# Patient Record
Sex: Male | Born: 1935 | Race: White | Hispanic: No | Marital: Married | Smoking: Never smoker
Health system: Southern US, Community
[De-identification: ages and names within clinical notes are randomized; demographics above are authoritative.]

## PROBLEM LIST (undated history)

## (undated) DIAGNOSIS — I10 Essential (primary) hypertension: Secondary | ICD-10-CM

## (undated) DIAGNOSIS — I639 Cerebral infarction, unspecified: Secondary | ICD-10-CM

## (undated) HISTORY — DX: Cerebral infarction, unspecified: I63.9

## (undated) HISTORY — DX: Essential (primary) hypertension: I10

---

## 2019-12-02 ENCOUNTER — Ambulatory Visit: Payer: Self-pay

## 2020-06-27 ENCOUNTER — Emergency Department (HOSPITAL_BASED_OUTPATIENT_CLINIC_OR_DEPARTMENT_OTHER): Payer: BC Managed Care – PPO

## 2020-06-27 ENCOUNTER — Emergency Department (HOSPITAL_BASED_OUTPATIENT_CLINIC_OR_DEPARTMENT_OTHER)
Admission: EM | Admit: 2020-06-27 | Discharge: 2020-06-28 | Disposition: A | Discharge status: Home / Self Care | Payer: BC Managed Care – PPO | Attending: Emergency Medicine | Admitting: Emergency Medicine

## 2020-06-27 ENCOUNTER — Other Ambulatory Visit: Payer: Self-pay

## 2020-06-27 ENCOUNTER — Encounter (HOSPITAL_BASED_OUTPATIENT_CLINIC_OR_DEPARTMENT_OTHER): Payer: Self-pay | Admitting: Emergency Medicine

## 2020-06-27 DIAGNOSIS — Z7901 Long term (current) use of anticoagulants: Secondary | ICD-10-CM | POA: Insufficient documentation

## 2020-06-27 DIAGNOSIS — R42 Dizziness and giddiness: Secondary | ICD-10-CM | POA: Insufficient documentation

## 2020-06-27 DIAGNOSIS — I10 Essential (primary) hypertension: Secondary | ICD-10-CM | POA: Insufficient documentation

## 2020-06-27 LAB — COMPREHENSIVE METABOLIC PANEL
ALT: 12 U/L (ref 0–44)
AST: 14 U/L — ABNORMAL LOW (ref 15–41)
Albumin: 4.3 g/dL (ref 3.5–5.0)
Alkaline Phosphatase: 32 U/L — ABNORMAL LOW (ref 38–126)
Anion gap: 10 (ref 5–15)
BUN: 17 mg/dL (ref 8–23)
CO2: 27 mmol/L (ref 22–32)
Calcium: 9.2 mg/dL (ref 8.9–10.3)
Chloride: 103 mmol/L (ref 98–111)
Creatinine, Ser: 1.2 mg/dL (ref 0.61–1.24)
GFR calc Af Amer: >60 mL/min (ref >60)
GFR calc non Af Amer: 56 mL/min — ABNORMAL LOW (ref >60)
Glucose, Bld: 104 mg/dL — ABNORMAL HIGH (ref 70–99)
Potassium: 3.6 mmol/L (ref 3.5–5.1)
Sodium: 140 mmol/L (ref 135–145)
Total Bilirubin: 0.4 mg/dL (ref 0.3–1.2)
Total Protein: 7.3 g/dL (ref 6.5–8.1)

## 2020-06-27 LAB — CBC WITH DIFFERENTIAL/PLATELET
Abs Immature Granulocytes: 0.03 10*3/uL (ref 0.00–0.07)
Basophils Absolute: 0.1 10*3/uL (ref 0.0–0.1)
Basophils Relative: 1 %
Eosinophils Absolute: 0.2 10*3/uL (ref 0.0–0.5)
Eosinophils Relative: 3 %
HCT: 46.5 % (ref 39.0–52.0)
Hemoglobin: 15.6 g/dL (ref 13.0–17.0)
Immature Granulocytes: 0 %
Lymphocytes Relative: 17 %
Lymphs Abs: 1.3 10*3/uL (ref 0.7–4.0)
MCH: 31 pg (ref 26.0–34.0)
MCHC: 33.5 g/dL (ref 30.0–36.0)
MCV: 92.4 fL (ref 80.0–100.0)
Monocytes Absolute: 0.5 10*3/uL (ref 0.1–1.0)
Monocytes Relative: 6 %
Neutro Abs: 5.6 10*3/uL (ref 1.7–7.7)
Neutrophils Relative %: 73 %
Platelets: 196 10*3/uL (ref 150–400)
RBC: 5.03 MIL/uL (ref 4.22–5.81)
RDW: 13.5 % (ref 11.5–15.5)
WBC: 7.7 10*3/uL (ref 4.0–10.5)
nRBC: 0 % (ref 0.0–0.2)

## 2020-06-27 LAB — TROPONIN I (HIGH SENSITIVITY): Troponin I (High Sensitivity): 6 ng/L (ref <18)

## 2020-06-27 LAB — PROTIME-INR
INR: 2.6 — ABNORMAL HIGH (ref 0.8–1.2)
Prothrombin Time: 27.3 seconds — ABNORMAL HIGH (ref 11.4–15.2)

## 2020-06-27 MED ORDER — MECLIZINE HCL 25 MG PO TABS
12.5000 mg | ORAL_TABLET | Freq: Once | ORAL | Status: AC
  Administered 2020-06-27: 12.5 mg via ORAL
  Filled 2020-06-27: qty 1

## 2020-06-27 MED ORDER — IOHEXOL 350 MG/ML SOLN
100.0000 mL | Freq: Once | INTRAVENOUS | Status: AC | PRN
  Administered 2020-06-27: 100 mL via INTRAVENOUS

## 2020-06-27 MED ORDER — METOCLOPRAMIDE HCL 5 MG/ML IJ SOLN
10.0000 mg | Freq: Once | INTRAMUSCULAR | Status: AC
  Administered 2020-06-27: 10 mg via INTRAVENOUS
  Filled 2020-06-27: qty 2

## 2020-06-27 MED ORDER — AMLODIPINE BESYLATE 5 MG PO TABS: 5.0000 mg | ORAL_TABLET | Freq: Once | ORAL | Status: DC

## 2020-06-27 MED ORDER — SODIUM CHLORIDE 0.9 % IV BOLUS
500.0000 mL | Freq: Once | INTRAVENOUS | Status: AC
  Administered 2020-06-27: 500 mL via INTRAVENOUS

## 2020-06-27 MED ORDER — DIPHENHYDRAMINE HCL 50 MG/ML IJ SOLN
12.5000 mg | Freq: Once | INTRAMUSCULAR | Status: AC
  Administered 2020-06-27: 12.5 mg via INTRAVENOUS
  Filled 2020-06-27: qty 1

## 2020-06-27 MED ORDER — SODIUM CHLORIDE 0.9 % IV BOLUS: 1000.0000 mL | Freq: Once | INTRAVENOUS | Status: DC

## 2020-06-27 NOTE — ED Triage Notes (Addendum)
Headache and dizziness since this am, states his b/p is up Ambulatory, States lightheaded. Pt amb. Steady gait. HX of dizziness per pt. PT states off bp meds for a month and was told to get back on it it his b/p was elevated. Pt not sure of how much to take and was told by PMD to come to ED.

## 2020-06-27 NOTE — ED Notes (Signed)
Swallow screen repeated, pt passed.

## 2020-06-27 NOTE — ED Provider Notes (Signed)
[facility] EMERGENCY DEPARTMENT Provider Note   CSN: [id number] Arrival date & time: 06/27/20  1922     History Chief Complaint  Patient presents with   Dizziness    Caleb Santana is a 85 y.o. male hx of HTN, cerebellar ataxia, here presenting with dizziness.  Patient has been having dizziness at ongoing for the last month or so.  Patient was thought to have orthostatic hypotension.  Patient's blood pressure medicine was discontinued recently due to orthostatic hypotension. Patient actually recently seen his neurologist several days ago and was thought to have cerebellar ataxia and orthostatic hypotension.  Patient states that today he has worsening dizziness.  His blood pressure also was elevated at 170s so he called his doctor and was sent in for evaluation.  Patient denies any trouble speaking or focal weakness.  The history is provided by the patient.       Past Medical History:  Diagnosis Date   Hypertension    Stroke [facility])     There are no problems to display for this patient.   History reviewed. No pertinent surgical history.     No family history on file.  Social History   Tobacco Use   Smoking status: Never Smoker   Smokeless tobacco: Never Used  Substance Use Topics   Alcohol use: Not on file   Drug use: Not on file    Home Medications Prior to Admission medications   Medication Sig Start Date End Date Taking? Authorizing Provider  tiotropium (SPIRIVA HANDIHALER) 18 MCG inhalation capsule INSERT 1 CAPSULE IN HANDIHALER AND INHALE THE CONTENTS BY MOUTH ONCE DAILY 01/03/16  Yes [provider]  warfarin (COUMADIN) 5 MG tablet TAKE 1 & 1/2 TABLET BY MOUTH ONCE DAILY EXCEPT TAKE 1 TABLET ON TUESDAYS &THURSDAYS and saturdays OR AS DIRECTED 05/20/10  Yes [provider]  BREO ELLIPTA 100-25 MCG/INH AEPB Inhale 1 puff into the lungs daily. 06/24/20   [provider]  clonazePAM (KLONOPIN) 1 MG tablet SMARTSIG:1 Tablet(s) By  Mouth Every Evening 06/25/20   [provider]  dutasteride (AVODART) 0.5 MG capsule Take 0.5 mg by mouth daily. 06/14/20   [provider]  hydrochlorothiazide (MICROZIDE) 12.5 MG capsule Take by mouth.    [provider]  loratadine (CLARITIN) 10 MG tablet Take by mouth.    [provider]  nitroGLYCERIN (NITROSTAT) 0.4 MG SL tablet SMARTSIG:1 Tablet(s) Sublingual PRN 02/08/20   [provider]  rosuvastatin (CRESTOR) 5 MG tablet Take by mouth.    [provider]    Allergies    Patient has no known allergies.  Review of Systems   Review of Systems  Neurological: Positive for dizziness.  All other systems reviewed and are negative.   Physical Exam Updated Vital Signs BP (!) 159/95    Pulse 84    Temp 98.2 F (36.8 C) (Oral)    Resp 18    Ht 5\' 6"  (1.676 m)    Wt 83.9 kg    SpO2 95%    BMI 29.86 kg/m   Physical Exam Vitals and nursing note reviewed.  HENT:     Head: Normocephalic.     Nose: Nose normal.     Mouth/Throat:     Mouth: Mucous membranes are moist.  Eyes:     Extraocular Movements: Extraocular movements intact.     Pupils: Pupils are equal, round, and reactive to light.     Comments: No nystagmus   Cardiovascular:     Rate  and Rhythm: Normal rate and regular rhythm.     Pulses: Normal pulses.     Heart sounds: Normal heart sounds.  Pulmonary:     Effort: Pulmonary effort is normal.     Breath sounds: Normal breath sounds.  Abdominal:     General: Abdomen is flat.     Palpations: Abdomen is soft.  Musculoskeletal:        General: Normal range of motion.     Cervical back: Normal range of motion.  Skin:    General: Skin is warm.     Capillary Refill: Capillary refill takes less than 2 seconds.  Neurological:     General: No focal deficit present.     Mental Status: He is alert and oriented to person, place, and time.     Comments: Cranial nerve II to XII intact.  Normal finger-to-nose bilaterally.  No  pronator drift.  Normal strength and sensation throughout.  Ambulated by himself with steady gait.   Psychiatric:        Mood and Affect: Mood normal.     ED Results / Procedures / Treatments   Labs (all labs ordered are listed, but only abnormal results are displayed) Labs Reviewed  COMPREHENSIVE METABOLIC PANEL - Abnormal; Notable for the following components:      Result Value   Glucose, Bld 104 (*)    AST 14 (*)    Alkaline Phosphatase 32 (*)    GFR calc non Af Amer 56 (*)    All other components within normal limits  PROTIME-INR - Abnormal; Notable for the following components:   Prothrombin Time 27.3 (*)    INR 2.6 (*)    All other components within normal limits  CBC WITH DIFFERENTIAL/PLATELET  TROPONIN I (HIGH SENSITIVITY)  TROPONIN I (HIGH SENSITIVITY)    EKG EKG Interpretation  Date/Time:  Thursday June 27 2020 19:32:36 EDT Ventricular Rate:  78 PR Interval:  174 QRS Duration: 82 QT Interval:  358 QTC Calculation: 408 R Axis:   48 Text Interpretation: Normal sinus rhythm Septal infarct , age undetermined Abnormal ECG No previous ECGs available Confirmed by Richardean Canal [phone]) on 06/27/2020 8:41:26 PM   Radiology No results found.  Procedures Procedures (including critical care time)  Medications Ordered in ED Medications  metoCLOPramide (REGLAN) injection 10 mg (10 mg Intravenous Given 06/27/20 2156)  diphenhydrAMINE (BENADRYL) injection 12.5 mg (12.5 mg Intravenous Given 06/27/20 2205)  meclizine (ANTIVERT) tablet 12.5 mg (12.5 mg Oral Given 06/27/20 2318)  iohexol (OMNIPAQUE) 350 MG/ML injection 100 mL (100 mLs Intravenous Contrast Given 06/27/20 2248)  sodium chloride 0.9 % bolus 500 mL (500 mLs Intravenous New Bag/Given 06/27/20 2321)    ED Course  I have reviewed the triage vital signs and the nursing notes.  Pertinent labs & imaging results that were available during my care of the patient were reviewed by me and considered in my medical decision  making (see chart for details).    MDM Rules/Calculators/A&P                          Caleb Santana is a 85 y.o. male here with dizziness and hypertension.  Patient has a history of hypertension but also orthostatic hypotension so blood pressure medicine was discontinued. I am unclear if he had rebound hypertension or aneurysm rupture or just migraines. Plan to get CBC, CMP, CTA head and neck.  His dizziness has been going on for the last month or so  he has no other signs of stroke and have unremarkable neuro exam so if CTA is negative we will hold off on MRI right now  11:30 PM BP came down to 140.  Patient felt slightly dizzy when he stood up but is not orthostatic.  Patient felt better after migraine cocktail.  His INR is 2.6.  CTA head and neck pending. If negative, anticipate dc home.  Since he has orthostatic hypotension previously and is symptomatic even when he stands up, we will not put patient on blood pressure medicine.  He does have neurology follow-up already.  I signed out to Dr. Read Drivers, anticipate dc home if CTA negative.   Final Clinical Impression(s) / ED Diagnoses Final diagnoses:  None    Rx / DC Orders ED Discharge Orders    None       Charlynne Pander, MD 06/27/20 2332

## 2020-06-27 NOTE — Discharge Instructions (Signed)
You have orthostatic hypotension so please stay hydrated.  See your doctor in a week for follow-up.  Please also follow-up with neurologist.  Return to ER if you have worse dizziness, trouble speaking, trouble walking, weakness.

## 2020-06-28 NOTE — ED Provider Notes (Signed)
Nursing notes and vitals signs, including pulse oximetry, reviewed.  Summary of this visit's results, reviewed by myself:  EKG:  EKG Interpretation  Date/Time:  Thursday June 27 2020 19:32:36 EDT Ventricular Rate:  78 PR Interval:  174 QRS Duration: 82 QT Interval:  358 QTC Calculation: 408 R Axis:   48 Text Interpretation: Normal sinus rhythm Septal infarct , age undetermined Abnormal ECG No previous ECGs available Confirmed by Richardean CanalYao, David H [phone]([id number]) on 06/27/2020 8:41:26 PM       Labs:  Results for orders placed or performed during the hospital encounter of 06/27/20 (from the past 24 hour(s))  CBC with Differential/Platelet     Status: None   Collection Time: 06/27/20 10:09 PM  Result Value Ref Range   WBC 7.7 4.0 - 10.5 K/uL   RBC 5.03 4.22 - 5.81 MIL/uL   Hemoglobin 15.6 13.0 - 17.0 g/dL   HCT 19.146.5 39 - 52 %   MCV 92.4 80.0 - 100.0 fL   MCH 31.0 26.0 - 34.0 pg   MCHC 33.5 30.0 - 36.0 g/dL   RDW 47.813.5 29.511.5 - 62.115.5 %   Platelets 196 150 - 400 K/uL   nRBC 0.0 0.0 - 0.2 %   Neutrophils Relative % 73 %   Neutro Abs 5.6 1.7 - 7.7 K/uL   Lymphocytes Relative 17 %   Lymphs Abs 1.3 0.7 - 4.0 K/uL   Monocytes Relative 6 %   Monocytes Absolute 0.5 0 - 1 K/uL   Eosinophils Relative 3 %   Eosinophils Absolute 0.2 0 - 0 K/uL   Basophils Relative 1 %   Basophils Absolute 0.1 0 - 0 K/uL   Immature Granulocytes 0 %   Abs Immature Granulocytes 0.03 0.00 - 0.07 K/uL  Comprehensive metabolic panel     Status: Abnormal   Collection Time: 06/27/20 10:09 PM  Result Value Ref Range   Sodium 140 135 - 145 mmol/L   Potassium 3.6 3.5 - 5.1 mmol/L   Chloride 103 98 - 111 mmol/L   CO2 27 22 - 32 mmol/L   Glucose, Bld 104 (H) 70 - 99 mg/dL   BUN 17 8 - 23 mg/dL   Creatinine, Ser 3.081.20 0.61 - 1.24 mg/dL   Calcium 9.2 8.9 - 65.710.3 mg/dL   Total Protein 7.3 6.5 - 8.1 g/dL   Albumin 4.3 3.5 - 5.0 g/dL   AST 14 (L) 15 - 41 U/L   ALT 12 0 - 44 U/L   Alkaline Phosphatase 32 (L) 38 - 126 U/L    Total Bilirubin 0.4 0.3 - 1.2 mg/dL   GFR calc non Af Amer 56 (L) >60 mL/min   GFR calc Af Amer >60 >60 mL/min   Anion gap 10 5 - 15  Troponin I (High Sensitivity)     Status: None   Collection Time: 06/27/20 10:09 PM  Result Value Ref Range   Troponin I (High Sensitivity) 6 <18 ng/L  Protime-INR     Status: Abnormal   Collection Time: 06/27/20 10:09 PM  Result Value Ref Range   Prothrombin Time 27.3 (H) 11.4 - 15.2 seconds   INR 2.6 (H) 0.8 - 1.2    Imaging Studies: CT Angio Head W or Wo Contrast  Result Date: 06/28/2020 CLINICAL DATA:  Initial evaluation for acute dizziness. EXAM: CT ANGIOGRAPHY HEAD AND NECK TECHNIQUE: Multidetector CT imaging of the head and neck was performed using the standard protocol during bolus administration of intravenous contrast. Multiplanar CT image reconstructions and MIPs were obtained  to evaluate the vascular anatomy. Carotid stenosis measurements (when applicable) are obtained utilizing NASCET criteria, using the distal internal carotid diameter as the denominator. CONTRAST:  OMNIPAQUE IOHEXOL 350 MG/ML SOLN COMPARISON:  Prior study from 08/19/2016. FINDINGS: CT HEAD FINDINGS Brain: Age-related cerebral atrophy with moderate chronic microvascular ischemic disease. No acute intracranial hemorrhage. No acute large vessel territory infarct. No mass lesion or midline shift. No hydrocephalus or extra-axial fluid collection. Vascular: No hyperdense vessel. Scattered vascular calcifications noted within the carotid siphons. Skull: Scalp soft tissues and calvarium within normal limits. Sinuses: Paranasal sinuses and mastoid air cells are clear. Orbits: Globes and orbital soft tissues demonstrate no acute finding. CTA NECK FINDINGS Aortic arch: Visualized aortic arch of normal caliber with normal branch pattern. Extensive atherosclerotic change present about the arch and origin of the great vessels. No hemodynamically significant stenosis. Right carotid system:  Right CCA patent from its origin to the bifurcation without stenosis. Mild eccentric plaque at the right bifurcation/proximal right ICA without significant stenosis. Right ICA patent distally without stenosis, dissection or occlusion. Left carotid system: Left CCA patent from its origin to the bifurcation without stenosis. Scattered calcified plaque about the left bifurcation/proximal left ICA with associated mild stenosis of up to 25% by NASCET criteria. Left ICA patent distally without stenosis, dissection or occlusion. Vertebral arteries: Left vertebral artery arises directly from the aortic arch. Short-segment 40-50% ostial stenosis noted at the origins of the vertebral arteries bilaterally. Vertebral arteries otherwise widely patent without stenosis, dissection or occlusion. Skeleton: No acute osseous abnormality. No worrisome osseous lesions. Moderate to advanced multilevel cervical spondylosis, most pronounced at C6-7. Other neck: No other acute abnormality within the neck. No mass lesion or adenopathy. Upper chest: Visualized upper chest demonstrates no acute finding. Mild atelectatic changes noted within the right upper lobe. Underlying centrilobular emphysema. Review of the MIP images confirms the above findings CTA HEAD FINDINGS Anterior circulation: Scattered atheromatous change throughout the petrous, cavernous, and supraclinoid ICAs without hemodynamically significant stenosis. A1 segments widely patent. Normal anterior communicating artery complex. Anterior cerebral arteries widely patent to their distal aspects without stenosis. No M1 stenosis or occlusion. Normal left MCA bifurcation. Right MCA trifurcations. Distal MCA branches well perfused and symmetric. Posterior circulation: Both vertebral arteries widely patent to the vertebrobasilar junction without stenosis. Right vertebral artery slightly dominant. Both picas patent. Basilar widely patent to its distal aspect without stenosis. Superior  cerebral arteries patent bilaterally. Both PCAs well perfused to their distal aspects without flow-limiting stenosis. Small bilateral posterior communicating arteries noted. Venous sinuses: Grossly patent allowing for timing the contrast bolus. Anatomic variants: None significant.  No intracranial aneurysm. Review of the MIP images confirms the above findings IMPRESSION: CT HEAD IMPRESSION: 1. No acute intracranial abnormality. 2. Generalized age-related cerebral atrophy with moderate chronic microvascular ischemic disease. CTA HEAD AND NECK IMPRESSION: 1. Negative CTA for large vessel occlusion. 2. Short-segment 40-50% ostial stenoses at the origins of the vertebral arteries bilaterally. Posterior circulation otherwise widely patent. 3. Additional moderate atherosclerotic change elsewhere about the major arterial vasculature of the head and neck. No other hemodynamically significant or correctable stenosis. 4. Aortic Atherosclerosis (ICD10-I70.0) and Emphysema (ICD10-J43.9). Electronically Signed   By: Rise Mu M.D.   On: 06/28/2020 00:05   CT Angio Neck W and/or Wo Contrast  Result Date: 06/28/2020 CLINICAL DATA:  Initial evaluation for acute dizziness. EXAM: CT ANGIOGRAPHY HEAD AND NECK TECHNIQUE: Multidetector CT imaging of the head and neck was performed using the standard protocol during bolus administration of  intravenous contrast. Multiplanar CT image reconstructions and MIPs were obtained to evaluate the vascular anatomy. Carotid stenosis measurements (when applicable) are obtained utilizing NASCET criteria, using the distal internal carotid diameter as the denominator. CONTRAST:  OMNIPAQUE IOHEXOL 350 MG/ML SOLN COMPARISON:  Prior study from 08/19/2016. FINDINGS: CT HEAD FINDINGS Brain: Age-related cerebral atrophy with moderate chronic microvascular ischemic disease. No acute intracranial hemorrhage. No acute large vessel territory infarct. No mass lesion or midline shift. No  hydrocephalus or extra-axial fluid collection. Vascular: No hyperdense vessel. Scattered vascular calcifications noted within the carotid siphons. Skull: Scalp soft tissues and calvarium within normal limits. Sinuses: Paranasal sinuses and mastoid air cells are clear. Orbits: Globes and orbital soft tissues demonstrate no acute finding. CTA NECK FINDINGS Aortic arch: Visualized aortic arch of normal caliber with normal branch pattern. Extensive atherosclerotic change present about the arch and origin of the great vessels. No hemodynamically significant stenosis. Right carotid system: Right CCA patent from its origin to the bifurcation without stenosis. Mild eccentric plaque at the right bifurcation/proximal right ICA without significant stenosis. Right ICA patent distally without stenosis, dissection or occlusion. Left carotid system: Left CCA patent from its origin to the bifurcation without stenosis. Scattered calcified plaque about the left bifurcation/proximal left ICA with associated mild stenosis of up to 25% by NASCET criteria. Left ICA patent distally without stenosis, dissection or occlusion. Vertebral arteries: Left vertebral artery arises directly from the aortic arch. Short-segment 40-50% ostial stenosis noted at the origins of the vertebral arteries bilaterally. Vertebral arteries otherwise widely patent without stenosis, dissection or occlusion. Skeleton: No acute osseous abnormality. No worrisome osseous lesions. Moderate to advanced multilevel cervical spondylosis, most pronounced at C6-7. Other neck: No other acute abnormality within the neck. No mass lesion or adenopathy. Upper chest: Visualized upper chest demonstrates no acute finding. Mild atelectatic changes noted within the right upper lobe. Underlying centrilobular emphysema. Review of the MIP images confirms the above findings CTA HEAD FINDINGS Anterior circulation: Scattered atheromatous change throughout the petrous, cavernous, and  supraclinoid ICAs without hemodynamically significant stenosis. A1 segments widely patent. Normal anterior communicating artery complex. Anterior cerebral arteries widely patent to their distal aspects without stenosis. No M1 stenosis or occlusion. Normal left MCA bifurcation. Right MCA trifurcations. Distal MCA branches well perfused and symmetric. Posterior circulation: Both vertebral arteries widely patent to the vertebrobasilar junction without stenosis. Right vertebral artery slightly dominant. Both picas patent. Basilar widely patent to its distal aspect without stenosis. Superior cerebral arteries patent bilaterally. Both PCAs well perfused to their distal aspects without flow-limiting stenosis. Small bilateral posterior communicating arteries noted. Venous sinuses: Grossly patent allowing for timing the contrast bolus. Anatomic variants: None significant.  No intracranial aneurysm. Review of the MIP images confirms the above findings IMPRESSION: CT HEAD IMPRESSION: 1. No acute intracranial abnormality. 2. Generalized age-related cerebral atrophy with moderate chronic microvascular ischemic disease. CTA HEAD AND NECK IMPRESSION: 1. Negative CTA for large vessel occlusion. 2. Short-segment 40-50% ostial stenoses at the origins of the vertebral arteries bilaterally. Posterior circulation otherwise widely patent. 3. Additional moderate atherosclerotic change elsewhere about the major arterial vasculature of the head and neck. No other hemodynamically significant or correctable stenosis. 4. Aortic Atherosclerosis (ICD10-I70.0) and Emphysema (ICD10-J43.9). Electronically Signed   By: Rise Mu M.D.   On: 06/28/2020 00:05   No evidence of significant large vessel occlusion and only 40 to 50% stenoses of the vertebral arteries are seen.  It would be unlikely for this to cause significant cerebral ischemia.  I believe  the patient is safe to be discharged home.   Bird Swetz, Jonny Ruiz, MD 06/28/20 [phone]

## 2022-04-11 IMAGING — CT CT ANGIO HEAD
1 of 11 series · 5 of 33 positions shown · IV contrast (omnipaque)
Comparison: Prior study from 08/19/2016.

CLINICAL DATA: Initial evaluation for acute dizziness.

EXAM:
CT ANGIOGRAPHY HEAD AND NECK
TECHNIQUE: Multidetector CT imaging of the head and neck was performed using
the standard protocol during bolus administration of intravenous
contrast. Multiplanar CT image reconstructions and MIPs were
obtained to evaluate the vascular anatomy. Carotid stenosis
measurements (when applicable) are obtained utilizing NASCET
criteria, using the distal internal carotid diameter as the
denominator.
CONTRAST:  100mL OMNIPAQUE IOHEXOL 350 MG/ML SOLN

[Series 8: axial thin · axial · 0.47mm/px · z∈[+1015,+1255]mm · 5 of 360 slices shown]
[im 60/360  soft-tissue]
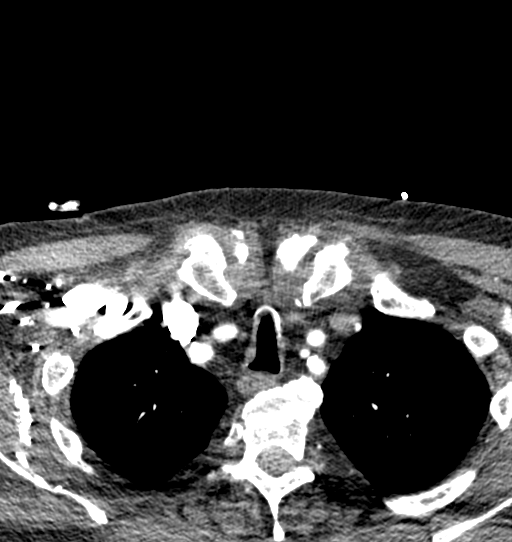
[im 120/360  bone]
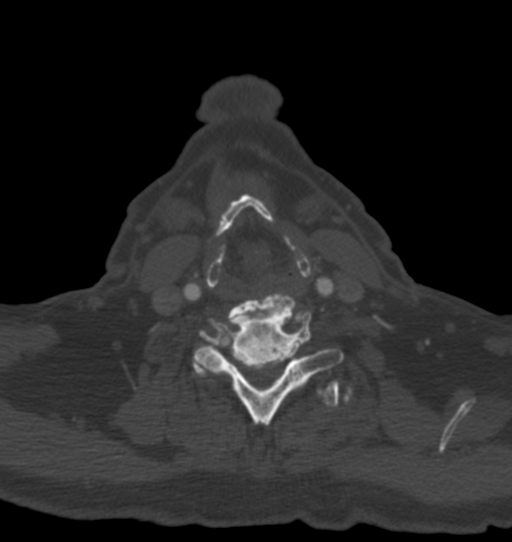
[im 180/360  soft-tissue]
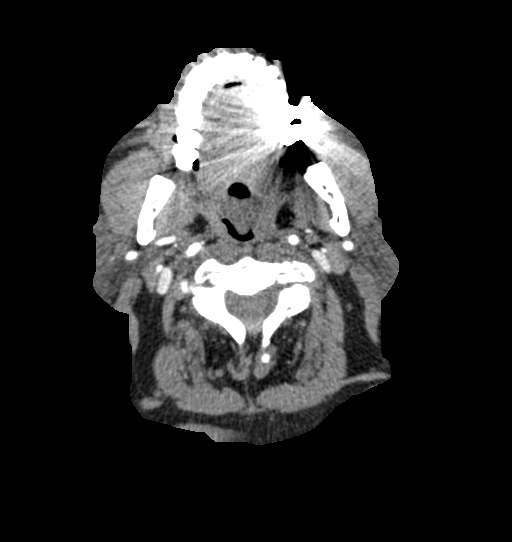
[im 240/360  bone]
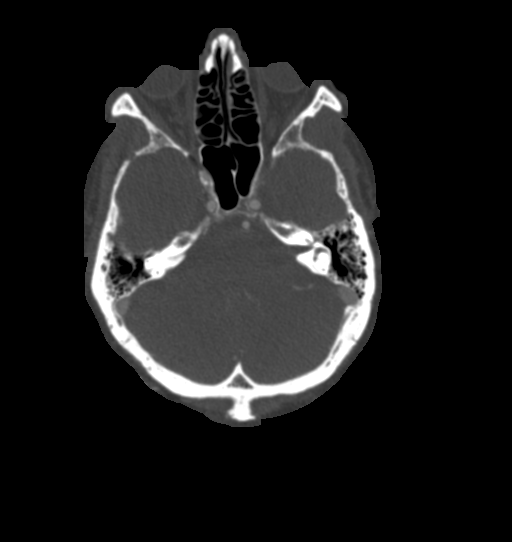
[im 300/360  soft-tissue]
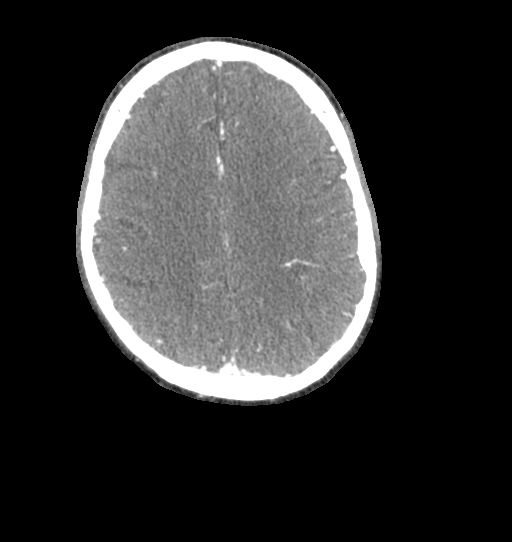

[5 of 33 positions shown; findings below may reference images not displayed]

FINDINGS: CT HEAD FINDINGS

Brain: Age-related cerebral atrophy with moderate chronic
microvascular ischemic disease. No acute intracranial hemorrhage. No
acute large vessel territory infarct. No mass lesion or midline
shift. No hydrocephalus or extra-axial fluid collection.

Vascular: No hyperdense vessel. Scattered vascular calcifications
noted within the carotid siphons.

Skull: Scalp soft tissues and calvarium within normal limits.

Sinuses: Paranasal sinuses and mastoid air cells are clear.

Orbits: Globes and orbital soft tissues demonstrate no acute
finding.

CTA NECK FINDINGS

Aortic arch: Visualized aortic arch of normal caliber with normal
branch pattern. Extensive atherosclerotic change present about the
arch and origin of the great vessels. No hemodynamically significant
stenosis.

Right carotid system: Right CCA patent from its origin to the
bifurcation without stenosis. Mild eccentric plaque at the right
bifurcation/proximal right ICA without significant stenosis. Right
ICA patent distally without stenosis, dissection or occlusion.

Left carotid system: Left CCA patent from its origin to the
bifurcation without stenosis. Scattered calcified plaque about the
left bifurcation/proximal left ICA with associated mild stenosis of
up to 25% by NASCET criteria. Left ICA patent distally without
stenosis, dissection or occlusion.

Vertebral arteries: Left vertebral artery arises directly from the
aortic arch. Short-segment 40-50% ostial stenosis noted at the
origins of the vertebral arteries bilaterally. Vertebral arteries
otherwise widely patent without stenosis, dissection or occlusion.

Skeleton: No acute osseous abnormality. No worrisome osseous
lesions. Moderate to advanced multilevel cervical spondylosis, most
pronounced at C6-7.

Other neck: No other acute abnormality within the neck. No mass
lesion or adenopathy.

Upper chest: Visualized upper chest demonstrates no acute finding.
Mild atelectatic changes noted within the right upper lobe.
Underlying centrilobular emphysema.

Review of the MIP images confirms the above findings

CTA HEAD FINDINGS

Anterior circulation: Scattered atheromatous change throughout the
petrous, cavernous, and supraclinoid ICAs without hemodynamically
significant stenosis. A1 segments widely patent. Normal anterior
communicating artery complex. Anterior cerebral arteries widely
patent to their distal aspects without stenosis. No M1 stenosis or
occlusion. Normal left MCA bifurcation. Right MCA trifurcations.
Distal MCA branches well perfused and symmetric.

Posterior circulation: Both vertebral arteries widely patent to the
vertebrobasilar junction without stenosis. Right vertebral artery
slightly dominant. Both picas patent. Basilar widely patent to its
distal aspect without stenosis. Superior cerebral arteries patent
bilaterally. Both PCAs well perfused to their distal aspects without
flow-limiting stenosis. Small bilateral posterior communicating
arteries noted.

Venous sinuses: Grossly patent allowing for timing the contrast
bolus.

Anatomic variants: None significant.  No intracranial aneurysm.

Review of the MIP images confirms the above findings
IMPRESSION: CT HEAD IMPRESSION:

1. No acute intracranial abnormality.
2. Generalized age-related cerebral atrophy with moderate chronic
microvascular ischemic disease.

CTA HEAD AND NECK IMPRESSION:

1. Negative CTA for large vessel occlusion.
2. Short-segment 40-50% ostial stenoses at the origins of the
vertebral arteries bilaterally. Posterior circulation otherwise
widely patent.
3. Additional moderate atherosclerotic change elsewhere about the
major arterial vasculature of the head and neck. No other
hemodynamically significant or correctable stenosis.
4. Aortic Atherosclerosis (HSC6O-XRP.P) and Emphysema (HSC6O-EDZ.7).

## 2024-07-04 NOTE — Progress Notes (Signed)
 Follow Up Note 07/04/2024  Reason for Visit  dizziness   \\lcm/ **dual coverage/ccw**  Assessment  1) Dizziness.  Unimproved.  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  Contributions from orthostatic hypotension. No other clear associated symptoms to suggest neurodegenerative process such as multisystem atrophy.  Would not exclude contributions from prediabetes, B12 deficiency.  We reviewed pathophysiology, treatment concepts and options as well as prognosis. Recommended continued use of staged standing measures and possible isometric exercises prior to standing up.  Also benefiting from adaptive measures through fitness center for balance training.  Tilt table testing completed 08/08/2020 confirmed orthostatic hypotension.  Will check EEG for recent different spells to rule out seizure-like activity.  Will also check cardiac rhythm monitoring through Zio patch to make sure there is no correlation with his intermittent dizzy spells and cardiac rhythms.  2) REM sleep behavior disorder.  Remains adherent with clonazepam.  Some questionable tolerability issues with melatonin low-dose trial.  No clear indication of diffuse Lewy body dementia.  Will arrange for formal sleep specialist evaluation.    3) Foot discomfort.  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  May next consider NCS. Would not exclude contribution to sense of imbalance.  Next: NCS, CTA, TCD, CD, sleep referral, SSRI, MoCA test  Plan  1) Orthostatic BPs today: positive today with SBP drop of 56 mmHg, DBP drop of 25 mmHg.  2) EEG, rule out seizure for brief spells. 3) Zio patch cardiac rhythm monitoring.   I plan to see Caleb Santana back for No follow-ups on file.  He understands to call me for any questions or concerns.   ___________ L. Charlyne, MD Certifications in Neurology, Clinical Neurophysiology, Neuroimaging  HPI  Caleb Santana is a 89 y.o. WM with a history of dizziness. He is  accompanied by his wife today.  Patient worked in urgently today for worsening dizziness  Previously had orthostatic blood pressures checked and was positive for orthostatic hypotension.  He was rechecked by PCP soon thereafter and noted to have normal orthostatic blood pressures but did have reduction of his amlodipine .  Also concern raised for possibility of amplification of dizziness due to anxiety for which she was provided with sertraline and instructed to continue with clonazepam.  Pt was walking up incline at lunch time, felt sensation of lack of control, felt near to falling, but did not fall, assisted by strangers, was able to get to a car, first time to occur in a large spacious environs, typically last less than 1 minute, may feel weak  Has happened in the elevator at [place], body leaning, legs giving out, spouse braced him, staring at the time, momentary, no incontinence or convulsive activity  2 months ago close friend died, left luncheon, unable to go to funeral due to worsening dizziness Dizziness eases with seated position, does not interfere with driving Several times per week currently No correlation with meals, meds, fasting  Has BP and O2 at fitness center check and looks OK Patient underwent tilt table testing on 08/08/2020 confirming orthostatic hypotension  Previously underwent CTA of the intracranial vessels on 06/27/2020 indicating 30-50% stenosis of bilateral vertebral artery origins. Has undergone transcranial Dopplers on 08/19/2016. 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  Did receive steroid injection last month into olecranon bursitis, review of medical record indicates that it occurred in August, and denies any significant improvement of his symptoms and wife reports possibly even some worsening of symptoms in the past 2 weeks Some question raised  for a worsening dizziness while visiting a friend in hospice and attending his funeral prompting some  concern over emotional component to his dizziness  not convinced historically of profound improvement of dizzy spells with use of compression stockings  Initial Hx: Still with imbalance Generalized dizziness Usually daily, on feet May ease with recumbency, takes about 15 minutes No trials of compression stocking Encouraged fluid by previous neurologist Occasional Gatorade use Recent orthostatics Last CD with neurology Does not recall any rhythm monitoring No Tilt table testing No dietary restrictions or supplements Recent months having dizziness, requiring stopping, bolstering upon furniture, only when on feet While looking up had a spell of dizzy senation, held onto stair railing Now taking 1/2 tab of clonazepam for REM sleep behavior disorder, and wife notes that with the dose reduction he has only had 1 difficult night  Occasional tingling in bottom of feet ongoing for several months Denies any discoloration, weakness Borderline DM mentioned for years  Seen by PT at [facility], recommended evaluation by neuro-optician Seen by Dr. Denyce in [facility], a neuro-optician 8-9 yrs ago ruled out vestibular issue at [facility]  Denies any hallucinations, memory changes No changes in continence Denies any exposure to solvents, chemicals, heavy metals or recurrent head trauma Does have some dry eyes but no recurrent problematic xerophthalmia or xerostomia, no issues with weakness which improves with repeated effort to suggest Lambert-Eaton Denies any difficulty with changes in sweat patterns or salivary changes No difficulty with palpitations, history of problematic arrhythmias  Last TTE 10/06/2018 with ejection fraction of 60-65%, normal LV size, no systolic or diastolic dysfunction  Reviewed last PCP note from 04/24/2020 noting worsening balance.  Recall seeing a neurologist in the past and wishes to reestablish with neurology.  Has undergone PT in the past.  Reports 1 month  of tingling and numbness of the soles of both feet.  No pain.  Normal monofilament exam of bilateral feet.  Reviewed last neurology note with Dr. Daralyn from 10/21/2016.  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  Carotid Dopplers previously 1-39% stenoses.  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  Patient managed for insomnia. Had been taking clonazepam in 2015.  Melatonin caused mood disorders.  No RLS.  Previous episode of vertigo in 2016.  Noted to have REM sleep behavior disorder, diabetes, OSA, insomnia, hypertension, diplopia, cerebellar ataxia.  Medications at the time included clonazepam 1-2 mg nightly.  Recommendation was for 1 year follow-up.  Last available MRI brain 08/19/2016.  Images independently reviewed and interpreted.  Moderate degree of chronic microvascular ischemic changes with left parieto-occipital larger subcortical infarct apparent.  Possible iron deposition of the cerebellar vermis and basal ganglia bilaterally.  Exam  BP 136/75 (BP Location: Left arm, Patient Position: Sitting)   Pulse 80   SpO2 95%   Elderly white male in NAD. Afebrile. Cooperative with examiner. [place]/AT. Anicteric. Lids normal. S1, S2.  RRR.  No murmur.  Carotids without bruit. No audible wheeze.  CTA at bases.  No accessory respiratory muscle use. No rash in arms. No ecchymoses of arms/hands.   Language intact. Affect full. Mood euthymic. VFF.  EOMI. PER.  LT sense symmetric at face. Face symmetric. Phonation intact. Soft palate elevation symmetric.  Symmetric shoulder shrug.  Tongue symmetric without atrophy.  No pronator drift. No tremor seen. No bradykinesia.  Mildly increased tone in the right upper extremity with Froment maneuver. Strength symmetric and full  in upper and lower extremities. DTRs symmetric at UEs and LEs. RAM intact. FNF intact with mild tremulous quality.  Gait cautious.    Sensory exam symmetrically intact to UE proprioception; to cold in UEs.  Vibratory intact in all 4 distal extremities.    PM/SH  Allergies[1] Current Rx[2] Medical History[3] Surgical History[4] Family History[5]  LAB DATA   Lab Results  Component Value Date   WBC 7.90 09/13/2023   HGB 14.8 09/13/2023   HCT 42.2 09/13/2023   PLT 174 09/13/2023   CHOL 118 09/13/2023   TRIG 110 09/13/2023   HDL 49 (L) 09/13/2023   LDLDIRECT 48 10/02/2022   ALT 13 09/13/2023   AST 13 09/13/2023   NA 139 03/16/2024   K 4.3 03/16/2024   CL 100 03/16/2024   CREATININE 1.28 03/16/2024   BUN 16 03/16/2024   CO2 31 03/16/2024   TSH 1.409 09/13/2023   PSA 0.73 05/13/2020   INR 2.60 06/21/2023   HGBA1C 6.2 (H) 03/16/2024   Lab on 03/16/2024  Component Date Value Ref Range Status   Sodium 03/16/2024 139  136 - 145 mmol/L Final   Potassium 03/16/2024 4.3  3.5 - 5.1 mmol/L Final   NO VISIBLE HEMOLYSIS   Chloride 03/16/2024 100  98 - 107 mmol/L Final   CO2 03/16/2024 31  21 - 31 mmol/L Final   Anion Gap 03/16/2024 8  6 - 14 mmol/L Final   Glucose, Random 03/16/2024 103 (H)  70 - 99 mg/dL Final   Blood Urea Nitrogen (BUN) 03/16/2024 16  7 - 25 mg/dL Final   Creatinine 94/84/7974 1.28  0.70 - 1.30 mg/dL Final   eGFR 94/84/7974 54 (L)  >59 mL/min/1.29m2 Final   GFR estimated by CKD-EPI equations(NKF 2021).   Recommend confirmation of Cr-based eGFR by using Cys-based eGFR and other filtration markers (if applicable) in complex cases and clinical decision-making, as needed.   Calcium 03/16/2024 9.4  8.6 - 10.3 mg/dL Final   BUN/Creatinine Ratio 03/16/2024    Final   Creatinine is normal, ratio is not clinically indicated.    Hemoglobin A1c 03/16/2024 6.2 (H)  <5.7 % Final   Normal A1c:             Less than 5.7%  Prediabetes range A1c:  5.7% to 6.4%  Diabetes range A1c:     Greater than 6.4%   Estimated Average Glucose 03/16/2024 131  mg/dL Final   The ADA has supported the  calculation of Average Glucose (eAG) based on HbA1c measurements. However, the eAG reflects the average glycemic level over 2-3 months and it would not necessarily match single glucose laboratory measurements, nor reflect changes in the daily glucose concentration.    IMAGING DATA  CLINICAL DATA:  Dizziness and giddiness for 2 years.   EXAM: MRI HEAD WITHOUT CONTRAST   TECHNIQUE: Multiplanar, multiecho pulse sequences of the brain and surrounding structures were obtained without intravenous contrast.   COMPARISON:  08/27/2010   FINDINGS: Brain: No acute or interval infarction, hemorrhage, hydrocephalus, extra-axial collection or mass lesion.   Normal cerebral volume for age. Moderate chronic microvascular disease with ischemic gliosis throughout much of the cerebral white matter, periventricular predominant.   Vascular: Normal flow voids.   Skull and upper cervical spine: Normal marrow signal.   Sinuses/Orbits: Small mucous retention cysts in the right maxillary sinus.   CONCLUSION: 1. No acute or reversible finding. 2. Moderate chronic microvascular disease that is similar to 2011. Stable age normal brain volume.  Electronically Signed   By: Cassondra Roulette M.D.   On: 08/19/2016 15:24  ____________________  MDM   Orders Orders Placed This Encounter  Procedures   Holter monitor - 8-14 days    Standing Status:   Future    Expected Date:   07/04/2024    Expiration Date:   09/03/2025    How long will the Holter Monitor be worn?:   14 days    Reason for Exam:   Syncope    Preferred Scheduling Location::   [facility]    Holter Placement:   In Person   EEG    Standing Status:   Future    Expiration Date:   07/04/2025    Reason for exam:   seizure (Routine EEG)    Duration:   61 min    Age::   Adult    Preferred Scheduling Location:   [facility]   No orders of the defined types were placed in this encounter.         [1] Allergies Allergen Reactions   Ciprofloxacin Other (See Comments)   Latanoprost Other (See Comments)    INeffective   Ace Inhibitors Other (See Comments)    unknown  [2] Current Outpatient Medications  Medication Sig Dispense Refill   amLODIPine  (NORVASC ) 5 mg tablet Take 5 mg by mouth daily.     albuterol HFA (PROVENTIL HFA;VENTOLIN HFA;PROAIR HFA) 90 mcg/actuation inhaler INHALE 2 PUFFS INTO THE LUNGS EVERY SIX HOURS AS NEEDED FOR WHEEZING 6.7 g 3   aspirin 81 mg EC tablet Take 81 mg by mouth daily.     blood pressure test kit-medium kit Check blood pressure as needed. Icd - I10 1 each 0   Breo Ellipta 100-25 mcg/dose inhaler Inhale 1 puff daily. 180 each 3   clonazePAM (KlonoPIN) 1 mg tablet Take 1 tablet (1 mg total) by mouth nightly. 90 tablet 1   cyanocobalamin (VITAMIN B12) 1,000 mcg tablet Take 1,000 mcg by mouth daily. 90 tablet 3   dutasteride (AVODART) 0.5 mg capsule  (Patient taking differently: Take 0.5 mg by mouth daily.) 30 capsule 5   metroNIDAZOLE (METROCREAM) 0.75 % cream      nitroglycerin (NITROSTAT) 0.4 mg SL tablet Place 0.4 mg under the tongue every 5 (five) minutes as needed for chest pain. 25 tablet 3   pantoprazole (PROTONIX) 20 mg EC tablet Take 1 tablet (20 mg total) by mouth 2 (two) times a day. 180 tablet 1   rivaroxaban (Xarelto) 15 mg tablet Take 1 tablet (15 mg total) by mouth daily with dinner. 90 tablet 3   rosuvastatin (CRESTOR) 10 mg tablet TAKE 1 TABLET BY MOUTH DAILY FOR CHOLESTEROL 90 tablet 1   sertraline (ZOLOFT) 25 mg tablet Take 1 tablet (25 mg total) by mouth daily. 30 tablet 2   Spiriva with HandiHaler 18 mcg per inhalation Place 1 capsule into inhaler and inhale in the morning. (After placing 1 capsule in device, press green button, and immediately take 2 inhalations. Do NOT swallow capsule.). 30 capsule 5   No current facility-administered medications for this visit.  [3] Past Medical History: Diagnosis Date    Bronchiectasis    (CMD)    Cerebellar ataxia    (CMD) 08/02/2013   05/2019: Under care of neurology   Cerebrovascular accident (CVA)    (CMD) 10/21/2016   05/2019: Under care of neurology   COPD (chronic obstructive pulmonary disease)    (CMD)    Diabetes mellitus    (  CMD)    Difficulty urinating 05/13/2020   Diplopia 08/02/2013   Overview:  IMPRESSION: 08/28/2010 had spell of diplopia -skew deviation- some time ago.  To see [facility] for 2nd opinion.   Dry eye syndrome of both eyes 12/11/2019   DVT of deep femoral vein, right    (CMD) 04/26/2019   05/2019: Under care of cardiology/coumadin clinic   Essential hypertension 07/08/2012   Gastroesophageal reflux disease 07/08/2012   Glaucoma    Hearing aid worn 06/15/2016   History of laser assisted in situ keratomileusis 02/20/2015   History of pulmonary embolism 02/10/2016   Hyperlipidemia associated with type 2 diabetes mellitus    (CMD) 07/12/2018   Hypertension    Imbalance 06/15/2016   05/2019: Under care of neurology   Insomnia with sleep apnea 08/02/2013   Overview:  IMPRESSION: Continue lunesta 2 mg qhs 08/05/2011 possibley caused rash- substitute gabapentin  08/03/2012 patient now only on clonazepam, sleeps in chair from 2030 to 2200 then goes to bed after and has fragmented sleep after 0400 and feels off kilter during teh day ? change the habit.  08/02/2013 stable on clonazepam- still has early awakening, trial of low dose ambien Overview:  IMPRESSI   Keratoconjunctivitis sicca not specified as Sjogren's, bilateral    Macular edema 10/31/2013   Multiple subsegmental pulmonary emboli without acute cor pulmonale    (CMD) 04/26/2019   05/2019: Under care of cardiology/coumadin clinic   Obstructive sleep apnea 08/02/2013   [facility] IMPRESSION: Supine OSA (AHI=13 wotj desats tp 87% sup=all occurred-non nonsupine (04-18-06)  05/2019: previously under care of pulmonology   Orthostatic hypotension 09/16/2020   OSA (obstructive  sleep apnea)    Pannus (corneal), left    Pneumonia    Primary open angle glaucoma (POAG) of both eyes, moderate stage 12/11/2019   Primary open angle glaucoma of right eye, mild stage 04/18/2014   Pulmonary embolism    (CMD)    History of    Renal insufficiency, mild 05/25/2019   05/2019: He has a mild-moderate renal decline with GFR 49-62 dating back one year. No microalbuminuria.   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   Type 2 diabetes mellitus with stage 3 chronic kidney disease, without long-term current use of insulin    (CMD) 04/30/2016   VBI (vertebrobasilar insufficiency) 04/30/2016   05/2019: Under care of neurology   Vertigo 10/05/2017   05/2019: Under care of neurology  [4] Past Surgical History: Procedure Laterality Date   CARDIAC CATHETERIZATION     Procedure: CARDIAC CATHETERIZATION   CATARACT EXTRACTION Right    Procedure: YAG CAPSULOTOMY   CATARACT EXTRACTION Bilateral    Procedure: CATARACT EXTRACTION   COLONOSCOPY     Procedure: COLONOSCOPY   CYSTOSCOPY N/A 04/30/2021   Procedure: UROLIFT;  Surgeon: Charlie Fallow Puschinsky, MD;  Location: [facility];  Service: Urology;  Laterality: N/A;   ESOPHAGOGASTRODUODENOSCOPY     Procedure: ESOPHAGOGASTRODUODENOSCOPY   IRIDOTOMY / IRIDECTOMY     Procedure: IRIDOTOMY / IRIDECTOMY   NEUROPLASTY / TRANSPOSITION ULNAR NERVE AT ELBOW Right    Procedure: NEUROPLASTY / TRANSPOSITION ULNAR NERVE AT ELBOW   REFRACTIVE SURGERY Bilateral 1999   Procedure: REFRACTIVE SURGERY; Dr. Ryan  [5] Family History Adopted: Yes  Problem Relation Name Age of Onset   Blindness Neg Hx     Glaucoma Neg Hx  Macular degeneration Neg Hx     Cataracts Neg Hx

## 2024-07-07 NOTE — Progress Notes (Signed)
 [facility] NEUROLOGY  DIGITAL EEG REPORT   Keary Waterson  DOB: 11-30-1934 (89 y.o.) Reason for EEG Request:  1. Orthostatic hypotension  EEG       DATE OF TEST: 07/06/2024  Classification:  ABNORMAL (Drowsy)    1) Background slow. 2) Beta rhythm. Frontal.    IMPRESSION: This is an Abnormal, adult, and routine EEG.  Slowing of the background rhythm was seen and may reflect metabolic or medication underpinnings.  Frontal fast activity was also seen and may reflect anxiety or use of benzodiazepines. No spikes, sharp waves or paroxysmal discharges suggestive for epileptiform activity were detected during this study. Clinical correlation is advised.    ________  Technique: This is a technically satisfactory digital EEG performed with 21 Channel Digital acquisition device. The electrodes were arranged in the standard 10-20 International System of electrode placement, and other electrodes recorded eye movements and EKG. Total Recording time documented in the scanned flow sheet.    Description:  The record detected up to stage 1 of sleep. The background rhythm consisted of well organized and well developed waves of 7 more days after completing all more advanced several week long hz with medium amplitude, bilaterally synchronous and symmetrical activity. The background rhythm was maximally distributed over posterior regions and showed normal reactivity to eye opening, eye closure and alerting.  Frontal fast beta activity was seen throughout the record.  No slow wave abnormalities, asymmetries or epileptiform discharges were identified.  Hyperventilation not proved performed due to history COPD.  Photic stimulation produced no abnormalities.

## 2024-08-16 NOTE — Progress Notes (Signed)
 Office Visit  PCP: Murray CHRISTELLA Amos, MD  Reason for Visit:   routine visit for patient with below PMH:  1. Essential hypertension Well-controlled managed by the primary care   2. 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 Hyperlipidemia associated with type 2 diabetes mellitus (HCC) On statin therapy managed by the primary care   4. Stage 2 moderate COPD by GOLD classification (HCC) Uses inhalers. Managed by Pulmonary   5. Long term current use of anticoagulant therapy On warfarin for PE/DVT. He denies any bleeding issues   6. Obstructive sleep apnea He has not been using his CPAP consistently.  This has been managed by pulmonary   7. VBI (vertebrobasilar insufficiency) Followed by neurology    Assessment/Plan   1. Essential (primary) hypertension   2. Long term current use of anticoagulant therapy   3. DVT of deep femoral vein, right   4. Essential hypertension   5. Orthostatic hypotension   6.      History of DVT/PE 7.      Dizziness 8.      SVT  Recommendation: The patient continues to have intermittent dizzy episodes at least 1/day they are typically transient but can be experienced as a string of dizziness over a period of time.  The recent Holter monitor ordered by neurology did show a few episodes of atrial tachycardia the longest run being approximately 12 seconds with a rate of around 150, the fastest being much shorter around 8 beats at a rate of around 180.  The symptom marker was not activated so hard to know if he really had symptoms associated with that.  Based on the Holter monitor however I will change amlodipine  to diltiazem CD100 and 20 mg daily which should be a roughly equivalent dose in terms of blood pressure control.  If he needs further hypertension improvement the dose can be increased to 180 mg.  The monitor showed no evidence of clinical bradycardia arrhythmia.  The patient did  have an asymptomatic orthostatic drop of almost 50 mm by exam today this could also be an issue although the history is not consistently postural related.  I gave him some helpful hints temporize any orthostatic drop.  He is not edematous.  Could consider compression stockings if symptoms do not improve with diltiazem.  The patient is not on diuretic therapy.  I will see the patient again in 4 months to judge his response to the above medication change  Orders  No orders of the defined types were placed in this encounter.  No follow-ups on file.  HPI   Patient is having intermittent dizzy episodes that can occur at any setting anytime most recent severe 1 was during an exercise routine in the fitness center where he was closely observed became dizzy when he was doing a new type of exercise which is more of a angular jumping exercise.  He became dizzy BP was measured as low he was placed supine and feet raised and he recovered fairly rapidly.  He did not require transfer to the emergency department.  The patient was not having vasovagal symptoms nor does he typically.  He is not syncopized.  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  Physical Examination:  VITAL SIGNS: There were no vitals filed for this visit.  BP a bit elevated at 171/80 sitting 121/71 standing heart exam is normal carotids normal JVP normal no peripheral  edema patient appears well he is in no respiratory distress.  HB CHEEK MD [facility]  Past Medical History,  Past Surgical History, Family History, Social History, Medications, Allergies, Social History: As reviewed in EPIC  Review of Systems: All positive and pertinent negatives are noted in the HPI; otherwise all other systems are negative   Objective Data Reviewed During this Patient Encounter:    EKG  done this visit shows normal sinus rhythm, WNL   @[id number] @  Care Everywhere   Lab Results   Component Value Date   CHOL 136 07/25/2024   TRIG 93 07/25/2024   HDL 54 (L) 07/25/2024   Lab Results  Component Value Date   WBC 7.60 07/25/2024   HGB 14.6 07/25/2024   HCT 41.5 07/25/2024   PLT 167 07/25/2024   Lab Results  Component Value Date   NA 139 07/25/2024   K 4.4 07/25/2024   CL 100 07/25/2024   CO2 30 07/25/2024   BUN 18 07/25/2024   CREATININE 1.24 07/25/2024   AST 13 07/25/2024   ALT 13 07/25/2024     Current Medications[1]      [1]  Current Outpatient Medications:    albuterol HFA (PROVENTIL HFA;VENTOLIN HFA;PROAIR HFA) 90 mcg/actuation inhaler, INHALE 2 PUFFS INTO THE LUNGS EVERY SIX HOURS AS NEEDED FOR WHEEZING, Disp: 6.7 g, Rfl: 3   amLODIPine  (NORVASC ) 2.5 mg tablet, Take 1 tablet (2.5 mg total) by mouth daily., Disp: , Rfl:    aspirin 81 mg EC tablet, Take 81 mg by mouth daily., Disp: , Rfl:    blood pressure test kit-medium kit, Check blood pressure as needed. Icd - I10, Disp: 1 each, Rfl: 0   Breo Ellipta 100-25 mcg/dose inhaler, Inhale 1 puff daily., Disp: 180 each, Rfl: 3   clonazePAM (KlonoPIN) 1 mg tablet, Take 1 tablet (1 mg total) by mouth nightly., Disp: 90 tablet, Rfl: 1   cyanocobalamin (VITAMIN B12) 1,000 mcg tablet, Take 2 tablets (2,000 mcg total) by mouth daily., Disp: , Rfl:    dutasteride (AVODART) 0.5 mg capsule, , Disp: 30 capsule, Rfl: 5   magnesium chloride (Slow-Mag) 71.5 mg TbEC tablet, Take 1 tablet (71.5 mg total) by mouth daily., Disp: 30 tablet, Rfl: 5   metroNIDAZOLE (METROCREAM) 0.75 % cream, , Disp: , Rfl:    nitroglycerin (NITROSTAT) 0.4 mg SL tablet, Place 0.4 mg under the tongue every 5 (five) minutes as needed for chest pain., Disp: 25 tablet, Rfl: 3   pantoprazole (PROTONIX) 20 mg EC tablet, Take 1 tablet (20 mg total) by mouth 2 (two) times a day., Disp: 180 tablet, Rfl: 1   rosuvastatin (CRESTOR) 10 mg tablet, TAKE 1 TABLET BY MOUTH DAILY FOR CHOLESTEROL, Disp: 90 tablet, Rfl: 1   sertraline (ZOLOFT)  25 mg tablet, TAKE 1 TABLET BY MOUTH DAILY, Disp: 90 tablet, Rfl: 0   Spiriva with HandiHaler 18 mcg per inhalation, Place 1 capsule into inhaler and inhale in the morning. (After placing 1 capsule in device, press green button, and immediately take 2 inhalations. Do NOT swallow capsule.)., Disp: 30 capsule, Rfl: 5   Xarelto 15 mg tablet, TAKE 1 TABLET (15 MG TOTAL) BY MOUTH DAILY WITH DINNER., Disp: 90 tablet, Rfl: 3

## 2024-09-19 NOTE — Progress Notes (Signed)
 Subjective:  Patient ID: Caleb Santana is a 89 y.o. male.  HPI: Caleb Santana comes today for f/u of HTN w/CKD 3A, Prediabetes,Adjustment Disorder w/mixed Anxiety/Depressed Mood and Hypomagnesemia. He mentioned he is still having episodes of dizziness/lightheadedness when he feels he gets weak and that he is going to fall.  He has stumbled and had 3 falls in the last 2 days. No spinning dizziness. He also c/o recurrent nosebleeds at night. He denies dysphagia, painful swallowing, N/V, abdominal pain, hematochezia or melena. No CP, SOB or leg pain with exertion. No PND, orthopnea, lightheadedness, presyncope or syncope. No wheezing, cough. No hypoglycemia.   The following portions of the patient's history were reviewed and updated as appropriate: allergies, current medications, past family history, past medical history, past social history, past surgical history, and problem list.  Review of Systems  Constitutional:  Negative for chills, fatigue and fever.  HENT:  Negative for ear pain, hearing loss and sinus pain.   Eyes:  Negative for visual disturbance.  Respiratory:  Negative for chest tightness and shortness of breath.   Cardiovascular:  Negative for chest pain, palpitations and leg swelling.  Gastrointestinal:  Negative for abdominal pain, blood in stool, constipation, diarrhea, nausea and vomiting.  Endocrine: Negative for cold intolerance, heat intolerance, polydipsia, polyphagia and polyuria.  Genitourinary:  Negative for dysuria.  Musculoskeletal:  Negative for back pain and myalgias.  Skin:  Negative for rash.  Neurological:  Negative for dizziness, syncope, light-headedness and headaches.  Psychiatric/Behavioral:  Negative for behavioral problems.     Objective Physical Exam Vitals and nursing note reviewed.  Constitutional:      Appearance: Normal appearance.  HENT:     Head: Normocephalic and atraumatic.  Eyes:     Extraocular Movements: Extraocular movements  intact.     Conjunctiva/sclera: Conjunctivae normal.     Pupils: Pupils are equal, round, and reactive to light.  Neck:     Vascular: No carotid bruit.  Cardiovascular:     Rate and Rhythm: Normal rate and regular rhythm.     Heart sounds: Normal heart sounds. No murmur heard.    No gallop.  Pulmonary:     Effort: Pulmonary effort is normal.     Breath sounds: Normal breath sounds. No wheezing.  Abdominal:     General: Bowel sounds are normal.     Palpations: Abdomen is soft.     Tenderness: There is no abdominal tenderness.  Musculoskeletal:        General: Normal range of motion.     Cervical back: Normal range of motion and neck supple. No rigidity or tenderness.     Right lower leg: No edema.     Left lower leg: No edema.  Lymphadenopathy:     Cervical: No cervical adenopathy.  Skin:    General: Skin is warm.     Findings: No erythema.  Neurological:     General: No focal deficit present.     Mental Status: He is alert and oriented to person, place, and time.  Psychiatric:        Mood and Affect: Mood normal.        Behavior: Behavior normal.     Assessment   1. Benign hypertension with stage 3a chronic kidney disease (CMD) (Primary).  Controlled with Diltiazem 120 mg every day. He was switched from Amlodipine  to Diltiazem by Dr. Launie due to SVT. During OV he had an asymptomatic drop of BP of 50 mmHg. So far w/u has been  negative for seizures or other vestibular abnormalities. Discussed he could have developed autonomic Dysfunction. Discussed to keep good hydration, use compression stocking knee high every day. I also offered walker with seat, but he hesitant to use one, however he agreed to get prescription.  Also discussed a trial of Diltiazem every other day. Discussed that BP could run a little bit higher, but I am trying to prevent sudden, significant drops on his BP.   PLAN:  Decrease  Diltiazem to every other day/ x2 wks. Continue using compression stockings.    2. Prediabetes.  He is working on diet.  PLAN: Continue working on diet.  3. Adjustment disorder with mixed anxiety and depressed mood.  Controlled with  Sertraline 25 mg every day.  PLAN:  Continue current treatment.  4. Hypomagnesemia.  On  Slow Magnesium 71 mg  bid.   PLAN:  Continue current treatment. Proceed with lab work as already ordered.    5. Gait abnormality.  He is still having episodes of dizziness/lightheadedness and has worsened since last visit. He has stumbled and fell 3 times in the last 2 days.   PLAN: -DME-4 wheel rolator w/seat- sent to [employer].   6. Epistaxis  He has waken up in the morning and finding blood on his pillow.   PLAN: -Ambulatory referral to ENT.   Behavioral Health Screening  Patient Health Questionnaire-2 Score: 0 (09/19/2024 12:00 PM)      Patient's Depression screening/score = Negative    Depression Plan: Normal/Negative Screening   Follow-up as previously scheduled for [facility].    Plan See above.

## 2024-10-03 NOTE — Progress Notes (Signed)
 Subjective:  Patient ID: Caleb Santana is a 89 y.o. male.  HPI: Carlton Buskey comes today for Medicare Wellness Exam and f/u of Gait Abnormality and HTN w/CKD 3a. He denies dysphagia, painful swallowing, N/V, abdominal pain, hematochezia or melena. No CP, SOB or leg pain with exertion. No PND, orthopnea. No wheezing, cough. He has no new complaints.  The following portions of the patient's history were reviewed and updated as appropriate: allergies, current medications, past family history, past medical history, past social history, past surgical history, and problem list.  Review of Systems  Constitutional:  Negative for chills, fatigue and fever.  HENT:  Negative for ear pain, hearing loss and sinus pain.   Eyes:  Negative for visual disturbance.  Respiratory:  Negative for chest tightness and shortness of breath.   Cardiovascular:  Negative for chest pain, palpitations and leg swelling.  Gastrointestinal:  Negative for abdominal pain, blood in stool, constipation, diarrhea, nausea and vomiting.  Endocrine: Negative for cold intolerance, heat intolerance, polydipsia, polyphagia and polyuria.  Genitourinary:  Negative for dysuria.  Musculoskeletal:  Negative for back pain and myalgias.  Skin:  Negative for rash.  Neurological:  Positive for dizziness. Negative for syncope, light-headedness and headaches.  Psychiatric/Behavioral:  Negative for behavioral problems.     Objective Physical Exam Vitals and nursing note reviewed.  Constitutional:      Appearance: Normal appearance.  HENT:     Head: Normocephalic and atraumatic.     Right Ear: Tympanic membrane, ear canal and external ear normal. There is no impacted cerumen.     Left Ear: Tympanic membrane, ear canal and external ear normal. There is no impacted cerumen.     Mouth/Throat:     Mouth: Mucous membranes are moist.     Pharynx: Oropharynx is clear. No oropharyngeal exudate or posterior oropharyngeal erythema.   Eyes:     Extraocular Movements: Extraocular movements intact.     Conjunctiva/sclera: Conjunctivae normal.     Pupils: Pupils are equal, round, and reactive to light.  Neck:     Vascular: No carotid bruit.  Cardiovascular:     Rate and Rhythm: Normal rate and regular rhythm.     Pulses: Normal pulses.     Heart sounds: Normal heart sounds. No murmur heard.    No gallop.  Pulmonary:     Effort: Pulmonary effort is normal.     Breath sounds: Normal breath sounds. No wheezing or rhonchi.  Abdominal:     General: Bowel sounds are normal. There is no distension.     Palpations: Abdomen is soft. There is no mass.     Tenderness: There is no abdominal tenderness. There is no guarding or rebound.     Hernia: No hernia is present.  Musculoskeletal:        General: Normal range of motion.     Cervical back: Normal range of motion and neck supple. No rigidity or tenderness.     Right lower leg: No edema.     Left lower leg: No edema.  Lymphadenopathy:     Cervical: No cervical adenopathy.  Skin:    General: Skin is warm.     Findings: No erythema.  Neurological:     General: No focal deficit present.     Mental Status: He is alert and oriented to person, place, and time.     Cranial Nerves: No cranial nerve deficit.     Motor: No weakness.     Coordination: Coordination normal.  Gait: Gait abnormal.     Deep Tendon Reflexes: Reflexes normal.  Psychiatric:        Mood and Affect: Mood normal.        Behavior: Behavior normal.     Assessment   1. Encounter for Medicare annual wellness exam (Primary).  No family history of colon or prostate cancer in first degree relatives. Pt is up to date with colonoscopy, ekg, flu, covid, shingrix and pneumonia vaccines.  Recommend to get Tdap vaccine at the pharmacy.  2. Gait abnormality.  He has had trouble with dizziness and unsteady gait for 5 years and has worsened in the last 6 months. He has been having PT done at the hospital. No  syncope, but sudden episodes of generalized weakness/dizziness when he feels he needs to sit down otherwise he has fallen. Has not bought walker yet.   PLAN: - MRI Brain WO Contrast; Future. Continue PT.  Get walker and start using it ASAP.   3. Benign hypertension with stage 3a chronic kidney disease (CMD).  Slightly elevated. He states he has been taking Diltiazem 120 mg every other day. He states there has been no change with dizziness even after decreasing the Diltiazem.  PLAN: Advise to go back to taking Diltiazem 120 mg every day.  4. Osteoporosis screening.  He is due for bone density.  PLAN: - DEXA Scan 1 or More Sites Axial; Future  5. Mild anemia.  Discussed labs. Asymptomatic. RBC 4.02, Hemoglobin 12.5 and Hematocrit 36.5.   PLAN: -Anemia profile -RBC,Folate -Vitamin B12    Behavioral Health Screening  Patient Health Questionnaire-2 Score: 0 (10/03/2024  2:19 PM)      Patient's Depression screening/score = Negative    Depression Plan: Normal/Negative Screening   Follow-up in  3 months.   Plan See above.  This document serves as a record of services personally performed by Dr. Delilah.  It was created on their behalf by Pinkey ONEIDA Neve, CMA, a trained medical scribe, and Certified Medical Assistant (CMA). During the course of documenting the history, physical exam and medical decision making, I was functioning as a stage manager. The creation of this record is the providers dictation and/or activities during the visit.  Electronically signed by Pinkey ONEIDA Neve, CMA 10/03/2024 2:11 PM

## 2024-10-03 NOTE — Progress Notes (Signed)
 [facility]  - INTERNAL MEDICINE [facility] Medicare Wellness Visit Type:: Subsequent Annual Wellness Visit  Name: Caleb Santana Date of Birth: [birth year] Age: 89 y.o. MRN: [id number] Visit Date: 10/03/2024  History obtained from: patient  Living Arrangements/Support System/Health Assessment/Pain/Stress Marital status: married Number of children: 3 Occupation: works part time Living arrangements: lives with spouse/significant other Does the patient have a support system (family, friend, church, conservation officer, nature, etc)?: Yes   Do you have any dental concerns?: No In the past month, have you experienced a change in your bladder control?: (!) Yes (urinary frequency at night)   Do you have any difficulty obtaining your medications?: No   Do you have trouble consistently taking or remembering to take all of your medications as prescribed?: No Patient rates overall stress level as: Some Does stress affect daily life?: No Typical amount of pain: none Does pain affect daily life?: No Are you currently prescribed opioids?: No                Depression Screening      Social History (Tobacco/Drugs/Sexual Activity) Chijioke reports that he quit smoking about 52 years ago. His smoking use included cigarettes. He started smoking about 71 years ago. He has a 19 pack-year smoking history. He has never used smokeless tobacco. Tobacco Use?: No How many times in the past year have you used a recreational drug or used a prescription medication for nonmedical reasons?: None Risk factors for sexually transmitted infections (i.e., multiple sexual partners): No Are you bothered by sexual problems?: No  Alcohol Screening How often do you have a drink containing alcohol?: 4 or more times a week How many standard drinks containing alcohol do you have on a typical day?: Never, 1 or 2 drinks How often do you have six or more drinks on one occasion?: Never Audit-C Score:  4  Physical Activity Regular exercise?: Yes Exercise frequency (times per week): 3 Exercise intensity: moderate (like brisk walking)  Diet How many meals a day?: 2 Eats fruit and vegetables daily?: Yes Most meals are obtained by: eating out, having others provide food  Home and Transportation Safety All rugs have non-skid backing?: Yes All stairs or steps have railings?: (!) No Grab bars in the bathtub or shower?: Yes Have non-skid surface in bathtub or shower?: Yes Good home lighting?: Yes Regular seat belt use?: Yes      Activities of Daily Living Feed self?: Yes Bathe self?: Yes Dress self?: Yes Use toilet without assistance?: Yes Walk without assistance?: Yes    Instrumental Activities of Daily Living Manage finances?: Yes Shop for themselves?: Yes Prepare meals?: (!) No Use the telephone?: Yes Manage medications?: Yes   Performs basic housework/laundry?: Yes Drives?: Yes Primary transportation is: driving  Hearing Concerns about hearing?: No Uses hearing aids?: (!) Yes Hear whispered voice? (Observed): (!) No  Vision Concerns about vision?: No Vision exam performed?: Yes (January 2026)  Fall Risk Is the patient ambulatory?: Yes One or more falls in the last year:: Yes (5 - 6 months ago) Feels unsteady when walking:: Yes  Cognitive Assessment Has a diagnosis of dementia or cognitive impairment?: No Are there any memory concerns by the patient, others, or providers?: No              Advance Directives Living will?: Yes Advance directive information provided to patient: No Healthcare POA?: Yes Name of Health Care Agent: Caleb Santana Relationship to patient: Spouse Health Care Agent's phone number: [phone]  Other History I reviewed and updated the following risk factors and conditions as appropriate: Reviewed/Updated: Problem List, Medical History, Surgical History, Family History, Medications, Allergies Reviewed/Updated:  Vital Signs (height, weight, and BP), Immunizations, Health Maintenance Patient Care Team Updated: Done  Vital Signs There were no vitals taken for this visit.  Screening and Immunizations Health Maintenance Status       Date Due Completion Dates   DTaP/Tdap/Td Vaccines (2 - Td or Tdap) 03/24/2025 (Originally 04/07/2022) 04/07/2012, 12/22/2004   COVID-19 Vaccine (9 - 2025-26 season) 01/11/2025 07/14/2024, 09/07/2022   Depression Monitoring 03/19/2025 09/19/2024   Diabetes: Retinopathy Screening Combo 05/29/2025 05/29/2024, 05/29/2024   Comment on 02/17/2022: See Legacy System   Comment on 11/29/2019: See Legacy System   Diabetes: Hemoglobin A1C 07/25/2025 07/25/2024, 07/25/2024   Comprehensive Annual Visit 10/03/2025 10/03/2024, 09/13/2023   Medicare Annual Wellness Visit: Traditional Medicare 10/03/2025 10/03/2024, 09/13/2023       Immunization History  Administered Date(s) Administered   Covid-19 Vaccine Unspecified 07/14/2024   Influenza Vaccine, Quadrivalent, Adjuvanted 09/13/2020   Influenza, High-dose Seasonal, Quadrivalent, Preservative Free 08/12/2021, 07/14/2024   Influenza, Unspecified 07/29/2017, 10/03/2018   Influenza, high-dose, trivalent, PF 09/02/2019, 09/13/2023   Moderna Covid-19, mRNA,LNP-S,PF 12+ Yrs 09/07/2022   Moderna SARS-CoV-2 Bivalent Booster 6+ yrs 09/18/2021   Moderna SARS-CoV-2 Monovalent Booster 18+ yrs 06/11/2021   Moderna SARS-CoV-2 Primary Series 12+ yrs 11/14/2019, 12/12/2019, 08/28/2020, 09/18/2021   Pneumococcal Conjugate 13-Valent 05/24/2014   Pneumococcal Polysaccharide Vaccine, 23 Valent (PNEUMOVAX-23) 2Y+ 11/03/1995, 12/22/2004, 05/25/2019   RSV, PF (ABRYSVO) 11/16/2023   TD vaccine (ADULT)PF(TENIVAC) 7Y+ 12/22/2004   TDAP VACCINE (BOOSTRIX,ADACEL) 7Y+ 04/07/2012   Varicella Zoster (SHINGRIX) 18Y+ 07/21/2017, 11/11/2017   Zoster, Live 11/05/2009    Assessment/Plan: Subsequent Annual Wellness Visit: The topics above were reviewed with  the patient.  Healthy lifestyle principles reviewed.  Recommendations provided when indicated.  Follow up 1 year for next wellness visit.  No orders of the defined types were placed in this encounter.   No orders of the defined types were placed in this encounter.   Patient Care Team: Murray CHRISTELLA Amos, MD as PCP - General (Internal Medicine) Murray CHRISTELLA Amos, MD as PCP - Attributed  Electronically signed by:

## 2024-10-12 ENCOUNTER — Emergency Department (HOSPITAL_BASED_OUTPATIENT_CLINIC_OR_DEPARTMENT_OTHER)

## 2024-10-12 ENCOUNTER — Inpatient Hospital Stay (HOSPITAL_BASED_OUTPATIENT_CLINIC_OR_DEPARTMENT_OTHER)
Admission: EM | Admit: 2024-10-12 | Discharge: 2024-10-17 | DRG: 069 | Disposition: A | Discharge status: Home / Self Care | Attending: Family Medicine | Admitting: Family Medicine

## 2024-10-12 ENCOUNTER — Other Ambulatory Visit: Payer: Self-pay

## 2024-10-12 ENCOUNTER — Encounter (HOSPITAL_BASED_OUTPATIENT_CLINIC_OR_DEPARTMENT_OTHER): Payer: Self-pay | Admitting: Emergency Medicine

## 2024-10-12 DIAGNOSIS — G459 Transient cerebral ischemic attack, unspecified: Secondary | ICD-10-CM | POA: Diagnosis present

## 2024-10-12 DIAGNOSIS — R55 Syncope and collapse: Secondary | ICD-10-CM

## 2024-10-12 DIAGNOSIS — R404 Transient alteration of awareness: Principal | ICD-10-CM

## 2024-10-12 LAB — CBC WITH DIFFERENTIAL/PLATELET
Abs Immature Granulocytes: 0.03 K/uL (ref 0.00–0.07)
Basophils Absolute: 0 K/uL (ref 0.0–0.1)
Basophils Relative: 0 %
Eosinophils Absolute: 0.2 K/uL (ref 0.0–0.5)
Eosinophils Relative: 2 %
HCT: 38.4 % — ABNORMAL LOW (ref 39.0–52.0)
Hemoglobin: 13 g/dL (ref 13.0–17.0)
Immature Granulocytes: 0 %
Lymphocytes Relative: 14 %
Lymphs Abs: 1.3 K/uL (ref 0.7–4.0)
MCH: 30.7 pg (ref 26.0–34.0)
MCHC: 33.9 g/dL (ref 30.0–36.0)
MCV: 90.8 fL (ref 80.0–100.0)
Monocytes Absolute: 0.4 K/uL (ref 0.1–1.0)
Monocytes Relative: 5 %
Neutro Abs: 7 K/uL (ref 1.7–7.7)
Neutrophils Relative %: 79 %
Platelets: 189 K/uL (ref 150–400)
RBC: 4.23 MIL/uL (ref 4.22–5.81)
RDW: 13.3 % (ref 11.5–15.5)
WBC: 9 K/uL (ref 4.0–10.5)
nRBC: 0 % (ref 0.0–0.2)

## 2024-10-12 LAB — COMPREHENSIVE METABOLIC PANEL WITH GFR
ALT: 10 U/L (ref 0–44)
AST: 14 U/L — ABNORMAL LOW (ref 15–41)
Albumin: 4.4 g/dL (ref 3.5–5.0)
Alkaline Phosphatase: 46 U/L (ref 38–126)
Anion gap: 13 (ref 5–15)
BUN: 20 mg/dL (ref 8–23)
CO2: 25 mmol/L (ref 22–32)
Calcium: 9.2 mg/dL (ref 8.9–10.3)
Chloride: 100 mmol/L (ref 98–111)
Creatinine, Ser: 1.21 mg/dL (ref 0.61–1.24)
GFR, Estimated: 58 mL/min — ABNORMAL LOW (ref >60)
Glucose, Bld: 98 mg/dL (ref 70–99)
Potassium: 4.2 mmol/L (ref 3.5–5.1)
Sodium: 137 mmol/L (ref 135–145)
Total Bilirubin: 0.7 mg/dL (ref 0.0–1.2)
Total Protein: 6.9 g/dL (ref 6.5–8.1)

## 2024-10-12 LAB — RESP PANEL BY RT-PCR (RSV, FLU A&B, COVID)  RVPGX2
Influenza A by PCR: NEGATIVE
Influenza B by PCR: NEGATIVE
Resp Syncytial Virus by PCR: NEGATIVE
SARS Coronavirus 2 by RT PCR: NEGATIVE

## 2024-10-12 LAB — URINALYSIS, W/ REFLEX TO CULTURE (INFECTION SUSPECTED)
Bilirubin Urine: NEGATIVE
Glucose, UA: NEGATIVE mg/dL
Hgb urine dipstick: NEGATIVE
Ketones, ur: NEGATIVE mg/dL
Leukocytes,Ua: NEGATIVE
Nitrite: NEGATIVE
Protein, ur: NEGATIVE mg/dL
Specific Gravity, Urine: 1.015 (ref 1.005–1.030)
pH: 6 (ref 5.0–8.0)

## 2024-10-12 LAB — PROTIME-INR
INR: 1.7 — ABNORMAL HIGH (ref 0.8–1.2)
Prothrombin Time: 21 s — ABNORMAL HIGH (ref 11.4–15.2)

## 2024-10-12 LAB — TROPONIN T, HIGH SENSITIVITY
Troponin T High Sensitivity: 26 ng/L — ABNORMAL HIGH (ref 0–19)
Troponin T High Sensitivity: 27 ng/L — ABNORMAL HIGH (ref 0–19)

## 2024-10-12 LAB — ETHANOL: Alcohol, Ethyl (B): <15 mg/dL (ref <15)

## 2024-10-12 LAB — CBG MONITORING, ED: Glucose-Capillary: 94 mg/dL (ref 70–99)

## 2024-10-12 MED ORDER — SODIUM CHLORIDE 0.9 % IV BOLUS
500.0000 mL | Freq: Once | INTRAVENOUS | Status: AC
  Administered 2024-10-12: 500 mL via INTRAVENOUS

## 2024-10-12 MED ORDER — ALPRAZOLAM 0.5 MG PO TABS
0.5000 mg | ORAL_TABLET | Freq: Once | ORAL | Status: AC
  Administered 2024-10-12: 0.5 mg via ORAL
  Filled 2024-10-12: qty 1

## 2024-10-12 MED ORDER — CLONAZEPAM 0.5 MG PO TABS: 0.5000 mg | ORAL_TABLET | Freq: Every day | ORAL | Status: DC

## 2024-10-12 MED ORDER — CLONAZEPAM 1 MG PO TABS: 1.0000 mg | ORAL_TABLET | Freq: Every day | ORAL | Status: DC

## 2024-10-12 NOTE — Plan of Care (Signed)
 Plan of Care Note for accepted transfer   Patient name: Caleb Santana FMW:[id number] DOB: Sep 03, 1935  Facility requesting transfer: [facility] Requesting Provider: Dr. Darra Facility course: 89 year old male with history of hypertension, stroke, CKD stage IIIa presenting for evaluation of acute onset dizziness/lightheadedness, confusion, and difficulty with speech at 4:15 PM while he was shopping.  Now back to baseline.  Vital signs stable.  EKG showing sinus rhythm and no obvious STEMI.  Initial troponin 26 and repeat pending.  No chest pain reported.  CT head showing no acute intracranial process.  Chest x-ray showing no active cardiopulmonary disease.  UA not suggestive of infection.  Ethanol level <15.  Not hypoglycemic.  500 mL IV fluid bolus given.  Admission requested for brain MRI due to concern for possible TIA.  Plan of care: The patient is accepted for admission to Telemetry unit at [facility].  [facility] will assume care on arrival to accepting facility. Until arrival, care as per EDP. However, [facility] available 24/7 for questions and assistance.  Check [email or web address] for on-call coverage.  Nursing staff, please call [facility] Admits & Consults System-Wide number under Amion on patient's arrival so appropriate admitting provider can evaluate the pt.

## 2024-10-12 NOTE — ED Notes (Signed)
 Pt is well moving with little to no assistance from bed to bathroom located outside room.  Steady gait.  Pt denies any symptoms at this time. Will continue to monitor.

## 2024-10-12 NOTE — ED Provider Notes (Signed)
 Emergency Department Provider Note   I have reviewed the triage vital signs and the nursing notes.   HISTORY  Chief Complaint Dizziness   HPI Caleb Santana is a 89 y.o. male past history of hypertension and prior stroke presents to the emergency department for evaluation of acute onset mental status change.  He was out shopping with his wife at [facility] when he suddenly became lightheaded and confused.  Last normal was 4:15 PM.  Patient states he was feeling pretty well and until suddenly he was lightheaded like he may pass out.  He did not experience chest pain or shortness of breath.  No severe headaches.  Denies vertigo.  No perceived unilateral weakness or numbness.  No speech changes.  Past Medical History:  Diagnosis Date   Hypertension    Stroke [facility])     Review of Systems  Constitutional: No fever/chills. Positive AMS.  Cardiovascular: Denies chest pain. Respiratory: Denies shortness of breath. Gastrointestinal: No abdominal pain.  No nausea, no vomiting.  No diarrhea.   Neurological: Negative for headaches, focal weakness or numbness.  ____________________________________________   PHYSICAL EXAM:  VITAL SIGNS: ED Triage Vitals [10/12/24 1716]  Encounter Vitals Group     BP 132/80     Pulse Rate 60     Resp 18     Temp 98 F (36.7 C)     Temp Source Oral     SpO2 96 %   Constitutional: Alert and oriented to person and place. Well appearing and in no acute distress. Eyes: Conjunctivae are normal. PERRL. EOMI. Head: Atraumatic. Nose: No congestion/rhinnorhea. Mouth/Throat: Mucous membranes are moist.  Neck: No stridor.   Cardiovascular: Normal rate, regular rhythm. Good peripheral circulation. Grossly normal heart sounds.   Respiratory: Normal respiratory effort.  No retractions. Lungs CTAB. Gastrointestinal: Soft and nontender. No distention.  Musculoskeletal: No lower extremity tenderness nor edema. No gross deformities of extremities. Neurologic:   Normal speech and language. No gross focal neurologic deficits are appreciated.  Skin:  Skin is warm, dry and intact. No rash noted.  ____________________________________________   LABS (all labs ordered are listed, but only abnormal results are displayed)  Labs Reviewed  COMPREHENSIVE METABOLIC PANEL WITH GFR - Abnormal; Notable for the following components:      Result Value   AST 14 (*)    GFR, Estimated 58 (*)    All other components within normal limits  CBC WITH DIFFERENTIAL/PLATELET - Abnormal; Notable for the following components:   HCT 38.4 (*)    All other components within normal limits  URINALYSIS, W/ REFLEX TO CULTURE (INFECTION SUSPECTED) - Abnormal; Notable for the following components:   Bacteria, UA RARE (*)    All other components within normal limits  PROTIME-INR - Abnormal; Notable for the following components:   Prothrombin Time 21.0 (*)    INR 1.7 (*)    All other components within normal limits  TROPONIN T, HIGH SENSITIVITY - Abnormal; Notable for the following components:   Troponin T High Sensitivity 26 (*)    All other components within normal limits  TROPONIN T, HIGH SENSITIVITY - Abnormal; Notable for the following components:   Troponin T High Sensitivity 27 (*)    All other components within normal limits  RESP PANEL BY RT-PCR (RSV, FLU A&B, COVID)  RVPGX2  ETHANOL  CBG MONITORING, ED   ____________________________________________  EKG   EKG Interpretation Date/Time:  Thursday October 12 2024 17:20:40 EST Ventricular Rate:  63 PR Interval:  193 QRS  Duration:  87 QT Interval:  497 QTC Calculation: 513 R Axis:   56  Text Interpretation: Sinus rhythm Probable anteroseptal infarct, old Borderline ST depression, anterolateral leads Prolonged QT interval Confirmed by Darra Chew [phone]) on 10/12/2024 5:30:50 PM        ____________________________________________  RADIOLOGY  DG Chest 2 View Result Date: 10/12/2024 CLINICAL DATA:   Dizziness and disorientation. EXAM: CHEST - 2 VIEW COMPARISON:  None Available. FINDINGS: Lungs are adequately inflated and otherwise clear. Cardiomediastinal silhouette is normal. Mild degenerative change of the spine. IMPRESSION: No active cardiopulmonary disease. Electronically Signed   By: Toribio Agreste M.D.   On: 10/12/2024 17:55   CT Head Wo Contrast Result Date: 10/12/2024 CLINICAL DATA:  Altered level of consciousness, dizziness, disorientation EXAM: CT HEAD WITHOUT CONTRAST TECHNIQUE: Contiguous axial images were obtained from the base of the skull through the vertex without intravenous contrast. RADIATION DOSE REDUCTION: This exam was performed according to the departmental dose-optimization program which includes automated exposure control, adjustment of the mA and/or kV according to patient size and/or use of iterative reconstruction technique. COMPARISON:  06/27/2020 FINDINGS: Brain: Confluent hypodensities are again seen throughout the periventricular white matter, compatible with chronic small vessel ischemic changes. No evidence of acute infarct or hemorrhage. Lateral ventricles and midline structures are unremarkable. No acute extra-axial fluid collections. No mass effect. Age-appropriate cerebral cortical atrophy. Vascular: No hyperdense vessel or unexpected calcification. Skull: Normal. Negative for fracture or focal lesion. Sinuses/Orbits: No acute finding. Other: None. IMPRESSION: 1. No acute intracranial process. Electronically Signed   By: Ozell Daring M.D.   On: 10/12/2024 17:55    ____________________________________________   PROCEDURES  Procedure(s) performed:   Procedures  None  ____________________________________________   INITIAL IMPRESSION / ASSESSMENT AND PLAN / ED COURSE  Pertinent labs & imaging results that were available during my care of the patient were reviewed by me and considered in my medical decision making (see chart for details).   This patient  is Presenting for Evaluation of AMS, which does require a range of treatment options, and is a complaint that involves a high risk of morbidity and mortality.  The Differential Diagnoses includes but is not exclusive to alcohol, illicit or prescription medications, intracranial pathology such as stroke, intracerebral hemorrhage, fever or infectious causes including sepsis, hypoxemia, uremia, trauma, endocrine related disorders such as diabetes, hypoglycemia, thyroid-related diseases, etc.   Critical Interventions-    Medications  sodium chloride  0.9 % bolus 500 mL (0 mLs Intravenous Stopped 10/12/24 2123)  ALPRAZolam (XANAX) tablet 0.5 mg (0.5 mg Oral Given 10/12/24 2144)    Reassessment after intervention: symptoms improved.    I did obtain Additional Historical Information from wife at bedside.    Clinical Laboratory Tests Ordered, included UA without infection. No leukocytosis. No AKI. LFTs normal. Troponin 27. Negative COVID. EtOH negative.   Radiologic Tests Ordered, included CT head and CXR. I independently interpreted the images and agree with radiology interpretation.   Cardiac Monitor Tracing which shows NSR.    Social Determinants of Health Risk patient is a non-smoker.   Consult complete with [facility], Dr. Alfornia. Plan for admit.   Medical Decision Making: Summary:  The patient presents to the emergency department for evaluation of acute onset AMS without focal neurodeficit.  I was called to triage to evaluate and patient has equal strength bilaterally without sensory deficit.  Dizziness described more as lightheadedness.  I do not appreciate any nystagmus. Plan for CT head, labs, UA, and EKG.   Reevaluation  with update and discussion with patient. He has returned to baseline. Unsure if this is near syncope vs TIA. Plan for admit. He and wife are in agreement.    Patient's presentation is most consistent with acute presentation with potential threat to life or bodily  function.   Disposition: admit  ____________________________________________  FINAL CLINICAL IMPRESSION(S) / ED DIAGNOSES  Final diagnoses:  Transient alteration of awareness  Near syncope    Note:  This document was prepared using Dragon voice recognition software and may include unintentional dictation errors.  Fonda Law, MD, [facility] Emergency Medicine    Tyrianna Lightle, Fonda MATSU, MD 10/12/24 [phone]

## 2024-10-12 NOTE — ED Notes (Signed)
 Unable to obtain labs, unsuccessful in triage.

## 2024-10-12 NOTE — ED Triage Notes (Signed)
 Pt presents after shopping at [employer] with his wife. Pt reportedly became dizzy and disoriented at 4:15 while shopping. Wife was concerned for stroke.  Dr. Darra to triage to evaluate patient. No code stroke activated at this time. Orders received.  Pt is alert and oriented to situation and place, not to time.

## 2024-10-12 NOTE — ED Notes (Signed)
 Pt in CT.

## 2024-10-13 ENCOUNTER — Other Ambulatory Visit (HOSPITAL_COMMUNITY): Payer: Self-pay

## 2024-10-13 ENCOUNTER — Observation Stay (HOSPITAL_COMMUNITY)

## 2024-10-13 DIAGNOSIS — R42 Dizziness and giddiness: Secondary | ICD-10-CM

## 2024-10-13 DIAGNOSIS — R410 Disorientation, unspecified: Secondary | ICD-10-CM

## 2024-10-13 DIAGNOSIS — G459 Transient cerebral ischemic attack, unspecified: Secondary | ICD-10-CM | POA: Diagnosis not present

## 2024-10-13 LAB — CBC
HCT: 37.9 % — ABNORMAL LOW (ref 39.0–52.0)
Hemoglobin: 12.8 g/dL — ABNORMAL LOW (ref 13.0–17.0)
MCH: 30.7 pg (ref 26.0–34.0)
MCHC: 33.8 g/dL (ref 30.0–36.0)
MCV: 90.9 fL (ref 80.0–100.0)
Platelets: 195 K/uL (ref 150–400)
RBC: 4.17 MIL/uL — ABNORMAL LOW (ref 4.22–5.81)
RDW: 13.3 % (ref 11.5–15.5)
WBC: 8.1 K/uL (ref 4.0–10.5)
nRBC: 0 % (ref 0.0–0.2)

## 2024-10-13 LAB — PROTIME-INR
INR: 1.3 — ABNORMAL HIGH (ref 0.8–1.2)
Prothrombin Time: 17.4 s — ABNORMAL HIGH (ref 11.4–15.2)

## 2024-10-13 LAB — CREATININE, SERUM
Creatinine, Ser: 1.12 mg/dL (ref 0.61–1.24)
GFR, Estimated: >60 mL/min (ref >60)

## 2024-10-13 MED ORDER — ASPIRIN 81 MG PO TBEC
81.0000 mg | DELAYED_RELEASE_TABLET | Freq: Every day | ORAL | Status: DC
  Administered 2024-10-13 – 2024-10-16 (×4): 81 mg via ORAL
  Filled 2024-10-13 (×4): qty 1

## 2024-10-13 MED ORDER — ENOXAPARIN SODIUM 40 MG/0.4ML IJ SOSY: 40.0000 mg | PREFILLED_SYRINGE | Freq: Every day | INTRAMUSCULAR | Status: DC

## 2024-10-13 MED ORDER — SERTRALINE HCL 50 MG PO TABS
25.0000 mg | ORAL_TABLET | Freq: Every day | ORAL | Status: DC
  Administered 2024-10-13 – 2024-10-17 (×5): 25 mg via ORAL
  Filled 2024-10-13 (×5): qty 1

## 2024-10-13 MED ORDER — ALBUTEROL SULFATE HFA 108 (90 BASE) MCG/ACT IN AERS: 1.0000 | INHALATION_SPRAY | Freq: Four times a day (QID) | RESPIRATORY_TRACT | Status: DC | PRN

## 2024-10-13 MED ORDER — STROKE: EARLY STAGES OF RECOVERY BOOK: Freq: Once | Status: AC

## 2024-10-13 MED ORDER — RIVAROXABAN 15 MG PO TABS
15.0000 mg | ORAL_TABLET | Freq: Every day | ORAL | Status: DC
  Administered 2024-10-13 – 2024-10-16 (×4): 15 mg via ORAL
  Filled 2024-10-13 (×5): qty 1

## 2024-10-13 MED ORDER — UMECLIDINIUM BROMIDE 62.5 MCG/ACT IN AEPB
1.0000 | INHALATION_SPRAY | Freq: Every day | RESPIRATORY_TRACT | Status: DC
  Administered 2024-10-14 – 2024-10-17 (×4): 1 via RESPIRATORY_TRACT
  Filled 2024-10-13 (×2): qty 7

## 2024-10-13 MED ORDER — HYDROCHLOROTHIAZIDE 25 MG PO TABS: 12.5000 mg | ORAL_TABLET | Freq: Every day | ORAL | Status: DC

## 2024-10-13 MED ORDER — VITAMIN B-12 1000 MCG PO TABS
2000.0000 ug | ORAL_TABLET | Freq: Every day | ORAL | Status: DC
  Administered 2024-10-13 – 2024-10-16 (×4): 2000 ug via ORAL
  Filled 2024-10-13 (×4): qty 2

## 2024-10-13 MED ORDER — ALBUTEROL SULFATE (2.5 MG/3ML) 0.083% IN NEBU: 2.5000 mg | INHALATION_SOLUTION | Freq: Four times a day (QID) | RESPIRATORY_TRACT | Status: DC | PRN

## 2024-10-13 MED ORDER — ROSUVASTATIN CALCIUM 5 MG PO TABS
10.0000 mg | ORAL_TABLET | Freq: Every day | ORAL | Status: DC
  Administered 2024-10-13 – 2024-10-17 (×5): 10 mg via ORAL
  Filled 2024-10-13 (×5): qty 2

## 2024-10-13 MED ORDER — LORATADINE 10 MG PO TABS
10.0000 mg | ORAL_TABLET | Freq: Every day | ORAL | Status: DC
  Administered 2024-10-13 – 2024-10-17 (×5): 10 mg via ORAL
  Filled 2024-10-13 (×5): qty 1

## 2024-10-13 MED ORDER — FLUTICASONE FUROATE-VILANTEROL 100-25 MCG/ACT IN AEPB
1.0000 | INHALATION_SPRAY | Freq: Every day | RESPIRATORY_TRACT | Status: DC
  Administered 2024-10-14 – 2024-10-17 (×4): 1 via RESPIRATORY_TRACT
  Filled 2024-10-13 (×2): qty 28

## 2024-10-13 NOTE — TOC Transition Note (Signed)
 Transition of Care [facility]) - Discharge Note   Patient Details  Name: Caleb Santana MRN: [id number] Date of Birth: [birth year]  Transition of Care [facility]) CM/SW Contact:  Andrez JULIANNA George, RN Phone Number: 10/13/2024, 1:28 PM   Clinical Narrative:     Caleb Santana is a 89 y.o. male with medical history significant of  HTN, CVA, CKD stage IIIa presenting for evaluation of acute onset dizziness/lightheadedness, confusion, and difficulty with speech while he was shopping, but is now back to baseline; thus, pt admitted for TIA evaluation.   Pt is from home with spouse. They are together most of the time.  Pt manages his own medications and drives self but wife can provide transportation as needed.  No DME at home. Pt goes to the [employer] [facility] 3 days a week.   No needs per IP Care management.  Wife to transport home.  Final next level of care: Home/Self Care Barriers to Discharge: No Barriers Identified   Patient Goals and CMS Choice            Discharge Placement                       Discharge Plan and Services Additional resources added to the After Visit Summary for                                       Social Drivers of Health (SDOH) Interventions SDOH Screenings   Food Insecurity: No Food Insecurity (10/13/2024)  Housing: Patient Declined (10/13/2024)  Transportation Needs: Patient Declined (10/13/2024)  Utilities: Patient Declined (10/13/2024)  Social Connections: Unknown (10/13/2024)  Tobacco Use: Low Risk (10/12/2024)  Recent Concern: Tobacco Use - Medium Risk (10/04/2024)   Received from [facility]     Readmission Risk Interventions     No data to display

## 2024-10-13 NOTE — ED Notes (Signed)
[facility] at the bedside °

## 2024-10-13 NOTE — Care Management Obs Status (Signed)
 MEDICARE OBSERVATION STATUS NOTIFICATION   Patient Details  Name: Caleb Santana MRN: [id number] Date of Birth: Oct 11, 1935   Medicare Observation Status Notification Given:  Yes Obs notice signed and copy given.    Nattie Lazenby 10/13/2024, 1:22 PM

## 2024-10-13 NOTE — ED Notes (Signed)
 Pt steady and independent to the bathroom at this time.  Pt still remains asymptomatic as well.  Will continue to monitor.

## 2024-10-13 NOTE — H&P (Signed)
 History and Physical    Patient: Caleb Santana FMW:[id number] DOB: [birth year] DOA: 10/12/2024 DOS: the patient was seen and examined on 10/13/2024 PCP: Delilah Murray HERO., MD  Patient coming from: Home  Chief Complaint:  Chief Complaint  Patient presents with   Dizziness   HPI: Aly Hauser is a 89 y.o. male with medical history significant of  HTN, CVA, CKD stage IIIa presenting for evaluation of acute onset dizziness/lightheadedness, confusion, and difficulty with speech while he was shopping, but is now back to baseline; thus, pt admitted for TIA evaluation.  The patient experienced confusion and difficulty communicating, which began around 4:15p. The patient reported being unable to concentrate and having trouble using coherent speech. The patient's family mentioned that he was shopping earlier in the day. The patient has never experienced similar symptoms before. However, over the past few years, the family noticed episodes characterized by a blank look and unsteadiness, requiring assistance to prevent falling. These episodes typically lasted about thirty minutes, after which the patient returned to normal Of note, the pt's neurologist had planned an EEG to eval epilepsy, but this has not been completed as of yet. During the most recent episode, the patient had difficulties using the car door due to impaired vision, but no falls were reported. The patient has a history of stroke around twenty years ago, which affected his eye but not in a similar manner to the current episode.  In the ED, pt bradycardic and hypertensive. EKG showing sinus rhythm and no obvious STEMI. Troponin 26-->27. No chest pain reported.  CT head showing no acute intracranial process.  Chest x-ray showing no active cardiopulmonary disease.  UA not suggestive of infection.  Ethanol level <15.  Not hypoglycemic.  500 mL IV fluid bolus given.  Admission requested for brain MRI due to concern for possible TIA.     Review of  Systems: As mentioned in the history of present illness. All other systems reviewed and are negative. Past Medical History:  Diagnosis Date   Hypertension    Stroke [facility])    History reviewed. No pertinent surgical history. Social History:  reports that he has never smoked. He has never used smokeless tobacco. No history on file for alcohol use and drug use.  Allergies[1]  History reviewed. No pertinent family history.  Prior to Admission medications  Medication Sig Start Date End Date Taking? Authorizing Provider  albuterol (VENTOLIN HFA) 108 (90 Base) MCG/ACT inhaler Inhale 1-2 puffs into the lungs every 6 (six) hours as needed for wheezing or shortness of breath.   Yes [provider]  aspirin EC 81 MG tablet Take 81 mg by mouth at bedtime. Swallow whole.   Yes [provider]  BREO ELLIPTA 100-25 MCG/INH AEPB Inhale 1 puff into the lungs daily. 06/24/20  Yes [provider]  clonazePAM (KLONOPIN) 1 MG tablet Take 1 mg by mouth at bedtime as needed (sleep). 06/25/20  Yes [provider]  cyanocobalamin (VITAMIN B12) 1000 MCG tablet Take 2,000 mcg by mouth at bedtime. 09/16/20  Yes [provider]  diltiazem (CARDIZEM CD) 120 MG 24 hr capsule Take 120 mg by mouth daily. 10/03/24 10/03/25 Yes [provider]  nitroGLYCERIN (NITROSTAT) 0.4 MG SL tablet Place 0.4 mg under the tongue every 5 (five) minutes as needed for chest pain. 02/08/20  Yes [provider]  Rivaroxaban (XARELTO) 15 MG TABS tablet Take 15 mg by mouth daily with supper.   Yes [provider]  rosuvastatin (CRESTOR) 10 MG tablet  Take 10 mg by mouth daily.   Yes [provider]  sertraline (ZOLOFT) 25 MG tablet Take 25 mg by mouth daily.   Yes [provider]  loratadine (CLARITIN) 10 MG tablet Take by mouth. Patient not taking: Reported on 10/13/2024    [provider]  tiotropium (SPIRIVA HANDIHALER) 18 MCG inhalation capsule  INSERT 1 CAPSULE IN HANDIHALER AND INHALE THE CONTENTS BY MOUTH ONCE DAILY Patient not taking: Reported on 10/13/2024 01/03/16   [provider]    Physical Exam: Vitals:   10/13/24 0200 10/13/24 0240 10/13/24 0400 10/13/24 0643  BP: (!) 140/69  (!) 149/77 138/71  Pulse: (!) 58  (!) 54 (!) 57  Resp: (!) 21  (!) 21 16  Temp:  98.6 F (37 C)  97.9 F (36.6 C)  TempSrc:  Oral  Oral  SpO2: 94%  94% 95%   General: Alert, oriented x3, resting comfortably in no acute distress Respiratory: Lungs clear to auscultation bilaterally with normal respiratory effort; no w/r/r Cardiovascular: Regular rate and rhythm w/o m/r/g   Data Reviewed:  Lab Results  Component Value Date   WBC 8.1 10/13/2024   HGB 12.8 (L) 10/13/2024   HCT 37.9 (L) 10/13/2024   MCV 90.9 10/13/2024   PLT 195 10/13/2024   Lab Results  Component Value Date   GLUCOSE 98 10/12/2024   CALCIUM 9.2 10/12/2024   NA 137 10/12/2024   K 4.2 10/12/2024   CO2 25 10/12/2024   CL 100 10/12/2024   BUN 20 10/12/2024   CREATININE 1.12 10/13/2024   Lab Results  Component Value Date   ALT 10 10/12/2024   AST 14 (L) 10/12/2024   ALKPHOS 46 10/12/2024   BILITOT 0.7 10/12/2024   Lab Results  Component Value Date   INR 1.3 (H) 10/13/2024   INR 1.7 (H) 10/12/2024   INR 2.6 (H) 06/27/2020   Radiology: DG Chest 2 View Result Date: 10/12/2024 CLINICAL DATA:  Dizziness and disorientation. EXAM: CHEST - 2 VIEW COMPARISON:  None Available. FINDINGS: Lungs are adequately inflated and otherwise clear. Cardiomediastinal silhouette is normal. Mild degenerative change of the spine. IMPRESSION: No active cardiopulmonary disease. Electronically Signed   By: Toribio Agreste M.D.   On: 10/12/2024 17:55   CT Head Wo Contrast Result Date: 10/12/2024 CLINICAL DATA:  Altered level of consciousness, dizziness, disorientation EXAM: CT HEAD WITHOUT CONTRAST TECHNIQUE: Contiguous axial images were obtained from the base of the skull through  the vertex without intravenous contrast. RADIATION DOSE REDUCTION: This exam was performed according to the departmental dose-optimization program which includes automated exposure control, adjustment of the mA and/or kV according to patient size and/or use of iterative reconstruction technique. COMPARISON:  06/27/2020 FINDINGS: Brain: Confluent hypodensities are again seen throughout the periventricular white matter, compatible with chronic small vessel ischemic changes. No evidence of acute infarct or hemorrhage. Lateral ventricles and midline structures are unremarkable. No acute extra-axial fluid collections. No mass effect. Age-appropriate cerebral cortical atrophy. Vascular: No hyperdense vessel or unexpected calcification. Skull: Normal. Negative for fracture or focal lesion. Sinuses/Orbits: No acute finding. Other: None. IMPRESSION: 1. No acute intracranial process. Electronically Signed   By: Ozell Daring M.D.   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  Acute  encephalopathy, now resolved H/o CVA (reportedly involving an eye 20 years ago) Possible TIA Possible Seizure Pt with grossly abnl episode yesterday, but family notes episodes of dizziness since changing his BP med from amlodipine  to diltiazem 6 weeks ago; will need MRI to r/o CVA and EEG to r/o sz -PTA ASA 81mg  daily and Crestor 10mg  daily -F/u MRI and EEG; consider Neuro consult if either abnl -HOLD pta diltiazem for now given c/f possible overmedication related to above sx  H/o PE on OAC -PTA Xarelto 15mg  daily  HTN -HOLD pta diltiazem 120mg  x1 for now; resume at lower dose if needed given HPI above  COPD -PTA Breo Ellipta daily, umeclidnium inh  daily, and albuterol inh prn   Advance Care Planning:   Code Status: Full Code   Consults: N/A  Family Communication: Wife and son  Severity of Illness: The appropriate patient status for this patient is INPATIENT. Inpatient status is judged to be reasonable and necessary in order to provide the required intensity of service to ensure the patient's safety. The patient's presenting symptoms, physical exam findings, and initial radiographic and laboratory data in the context of their chronic comorbidities is felt to place them at high risk for further clinical deterioration. Furthermore, it is not anticipated that the patient will be medically stable for discharge from the hospital within 2 midnights of admission.   * I certify that at the point of admission it is my clinical judgment that the patient will require inpatient hospital care spanning beyond 2 midnights from the point of admission due to high intensity of service, high risk for further deterioration and high frequency of surveillance required.*   ------- I spent 60 minutes reviewing previous notes, at the bedside counseling/discussing the treatment plan, and performing clinical documentation.  Author: Marsha Ada, MD 10/13/2024 9:50 AM  For on call review [email or web address].      [1] No Known Allergies

## 2024-10-14 ENCOUNTER — Observation Stay (HOSPITAL_COMMUNITY)

## 2024-10-14 DIAGNOSIS — R569 Unspecified convulsions: Secondary | ICD-10-CM

## 2024-10-14 DIAGNOSIS — G459 Transient cerebral ischemic attack, unspecified: Principal | ICD-10-CM

## 2024-10-14 LAB — CBC WITH DIFFERENTIAL/PLATELET
Abs Immature Granulocytes: 0.03 K/uL (ref 0.00–0.07)
Basophils Absolute: 0 K/uL (ref 0.0–0.1)
Basophils Relative: 1 %
Eosinophils Absolute: 0.2 K/uL (ref 0.0–0.5)
Eosinophils Relative: 2 %
HCT: 38.4 % — ABNORMAL LOW (ref 39.0–52.0)
Hemoglobin: 13.1 g/dL (ref 13.0–17.0)
Immature Granulocytes: 0 %
Lymphocytes Relative: 13 %
Lymphs Abs: 1.1 K/uL (ref 0.7–4.0)
MCH: 30.8 pg (ref 26.0–34.0)
MCHC: 34.1 g/dL (ref 30.0–36.0)
MCV: 90.4 fL (ref 80.0–100.0)
Monocytes Absolute: 0.4 K/uL (ref 0.1–1.0)
Monocytes Relative: 5 %
Neutro Abs: 6.3 K/uL (ref 1.7–7.7)
Neutrophils Relative %: 79 %
Platelets: 190 K/uL (ref 150–400)
RBC: 4.25 MIL/uL (ref 4.22–5.81)
RDW: 13.2 % (ref 11.5–15.5)
WBC: 8 K/uL (ref 4.0–10.5)
nRBC: 0 % (ref 0.0–0.2)

## 2024-10-14 LAB — PROTIME-INR
INR: 1.9 — ABNORMAL HIGH (ref 0.8–1.2)
Prothrombin Time: 22.7 s — ABNORMAL HIGH (ref 11.4–15.2)

## 2024-10-14 MED ORDER — AMLODIPINE BESYLATE 5 MG PO TABS
5.0000 mg | ORAL_TABLET | Freq: Every day | ORAL | Status: DC
  Administered 2024-10-14: 5 mg via ORAL
  Filled 2024-10-14 (×2): qty 1

## 2024-10-14 MED ORDER — HYDRALAZINE HCL 20 MG/ML IJ SOLN
10.0000 mg | Freq: Four times a day (QID) | INTRAMUSCULAR | Status: DC | PRN
  Administered 2024-10-14 – 2024-10-17 (×4): 10 mg via INTRAVENOUS
  Filled 2024-10-14 (×4): qty 1

## 2024-10-14 NOTE — Progress Notes (Signed)
 PROGRESS NOTE    [name]  FMW:[id number] DOB: September 26, 1935 DOA: 10/12/2024 PCP: Delilah Murray HERO., MD   Brief Narrative:  HPI: Caleb Santana is a 89 y.o. male with medical history significant of  HTN, CVA, CKD stage IIIa presenting for evaluation of acute onset dizziness/lightheadedness, confusion, and difficulty with speech while he was shopping, but is now back to baseline; thus, pt admitted for TIA evaluation.   The patient experienced confusion and difficulty communicating, which began around 4:15p. The patient reported being unable to concentrate and having trouble using coherent speech. The patient's family mentioned that he was shopping earlier in the day. The patient has never experienced similar symptoms before. However, over the past few years, the family noticed episodes characterized by a blank look and unsteadiness, requiring assistance to prevent falling. These episodes typically lasted about thirty minutes, after which the patient returned to normal Of note, the pt's neurologist had planned an EEG to eval epilepsy, but this has not been completed as of yet. During the most recent episode, the patient had difficulties using the car door due to impaired vision, but no falls were reported. The patient has a history of stroke around twenty years ago, which affected his eye but not in a similar manner to the current episode.   In the ED, pt bradycardic and hypertensive. EKG showing sinus rhythm and no obvious STEMI. Troponin 26-->27. No chest pain reported.  CT head showing no acute intracranial process.  Chest x-ray showing no active cardiopulmonary disease.  UA not suggestive of infection.  Ethanol level <15.  Not hypoglycemic.  500 mL IV fluid bolus given.  Admission requested for brain MRI due to concern for possible TIA.    Assessment & Plan:   Principal Problem:   TIA (transient ischemic attack)   Acute encephalopathy and strokelike symptoms/likely TIA, now resolved I was able to  gather more history from the wife, she tells me that patient has been suffering from dizziness for 20 years, more so since about 1 year and even more since about a month when along with dizziness, he is fainting, needing the family members to hold onto him.  However on Thursday, patient had dizziness, fainting and he was confused with slurred speech as well as blurred vision and those symptoms were resolved by the time he arrived to the ED.  Other than dizziness, patient has no other complaint at the moment.  Stroke ruled out with MRI negative.  However patient's symptoms are concerning for possible TIA.  Will need full TIA workup so I will order transthoracic echo.  PT OT also needs to see him since he feels very weak and wobbly on his feet, according to the wife.  EEG was ordered by admitting hospitalist which is pending as well.  We also need to rule out arrhythmia.  Sinus bradycardia: Patient was noted to have mild sinus bradycardia as well.  I do not think this mild degree of bradycardia is the reason for his symptoms though.  Monitor on telemetry.  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  Blood pressure is slightly elevated, I will start him on amlodipine  5 mg.   COPD -PTA Breo Ellipta  daily, umeclidnium inh daily, and albuterol  inh prn  DVT prophylaxis: Xarelto   Code Status: Full Code  Family Communication: Wife present at bedside.  Plan of care discussed with patient in length and he/she verbalized understanding and agreed with it.  Status is: Observation The patient will require care spanning > 2 midnights and should be moved to inpatient because: Needs echo, evaluation by PT OT,   Estimated body mass index is 29.86 kg/m as calculated from the  following:   Height as of 06/27/20: 5' 6 (1.676 m).   Weight as of 06/27/20: 83.9 kg.    Nutritional Assessment: There is no height or weight on file to calculate BMI.. Seen by dietician.  I agree with the assessment and plan as outlined below: Nutrition Status:        . Skin Assessment: I have examined the patient's skin and I agree with the wound assessment as performed by the wound care RN as outlined below:    Consultants:  None  Procedures:  None  Antimicrobials:  Anti-infectives (From admission, onward)    None         Subjective: Patient seen and examined, wife at the bedside.  Patient complains of dizziness and weakness.  No other complaint.  Objective: Vitals:   10/13/24 2109 10/14/24 0725 10/14/24 0758 10/14/24 1107  BP: (!) 174/81 (!) 172/79  (!) 149/74  Pulse: 61 64 64 77  Resp: 16 16 16 16   Temp: 98.1 F (36.7 C) 98.2 F (36.8 C)  97.6 F (36.4 C)  TempSrc: Oral Oral  Oral  SpO2: 97% 97%  95%   No intake or output data in the 24 hours ending 10/14/24 1143 There were no vitals filed for this visit.  Examination:  General exam: Appears calm and comfortable  Respiratory system: Clear to auscultation. Respiratory effort normal. Cardiovascular system: S1 & S2 heard, RRR. No JVD, murmurs, rubs, gallops or clicks. No pedal edema. Gastrointestinal system: Abdomen is nondistended, soft and nontender. No organomegaly or masses felt. Normal bowel sounds heard. Central nervous system: Alert and oriented. No focal neurological deficits. Extremities: Symmetric 5 x 5 power. Skin: No rashes, lesions or ulcers Psychiatry: Judgement and insight appear normal. Mood & affect appropriate.    Data Reviewed: I have personally reviewed following labs and imaging studies  CBC: Recent Labs  Lab 10/12/24 1824 10/13/24 0915 10/14/24 1005  WBC 9.0 8.1 8.0  NEUTROABS 7.0  --  6.3  HGB 13.0 12.8* 13.1  HCT 38.4* 37.9* 38.4*  MCV 90.8 90.9 90.4  PLT 189 195  190   Basic Metabolic Panel: Recent Labs  Lab 10/12/24 1824 10/13/24 0915  NA 137  --   K 4.2  --   CL 100  --   CO2 25  --   GLUCOSE 98  --   BUN 20  --   CREATININE 1.21 1.12  CALCIUM  9.2  --    GFR: CrCl cannot be calculated (Unknown ideal weight.). Liver Function Tests: Recent Labs  Lab 10/12/24 1824  AST 14*  ALT 10  ALKPHOS 46  BILITOT 0.7  PROT 6.9  ALBUMIN 4.4   No results for input(s): LIPASE, AMYLASE in the last 168 hours. No results for input(s): AMMONIA in the last 168 hours. Coagulation Profile: Recent Labs  Lab 10/12/24 1823 10/13/24 0507 10/14/24 0847  INR 1.7* 1.3* 1.9*   Cardiac Enzymes: No results for input(s): CKTOTAL, CKMB, CKMBINDEX, TROPONINI in the last 168 hours. BNP (last 3 results) No results for input(s): PROBNP in the last 8760 hours. HbA1C: No results for input(s): HGBA1C  in the last 72 hours. CBG: Recent Labs  Lab 10/12/24 1753  GLUCAP 94   Lipid Profile: No results for input(s): CHOL, HDL, LDLCALC, TRIG, CHOLHDL, LDLDIRECT in the last 72 hours. Thyroid Function Tests: No results for input(s): TSH, T4TOTAL, FREET4, T3FREE, THYROIDAB in the last 72 hours. Anemia Panel: No results for input(s): VITAMINB12, FOLATE, FERRITIN, TIBC, IRON, RETICCTPCT in the last 72 hours. Sepsis Labs: No results for input(s): PROCALCITON, LATICACIDVEN in the last 168 hours.  Recent Results (from the past 240 hours)  Resp panel by RT-PCR (RSV, Flu A&B, Covid) Anterior Nasal Swab     Status: None   Collection Time: 10/12/24  5:23 PM   Specimen: Anterior Nasal Swab  Result Value Ref Range Status   SARS Coronavirus 2 by RT PCR NEGATIVE NEGATIVE Final    Comment: (NOTE) SARS-CoV-2 target nucleic acids are NOT DETECTED.  The SARS-CoV-2 RNA is generally detectable in upper respiratory specimens during the acute phase of infection. The lowest concentration of SARS-CoV-2 viral copies this  assay can detect is 138 copies/mL. A negative result does not preclude SARS-Cov-2 infection and should not be used as the sole basis for treatment or other patient management decisions. A negative result may occur with  improper specimen collection/handling, submission of specimen other than nasopharyngeal swab, presence of viral mutation(s) within the areas targeted by this assay, and inadequate number of viral copies(<138 copies/mL). A negative result must be combined with clinical observations, patient history, and epidemiological information. The expected result is Negative.  Fact Sheet for Patients:  bloggercourse.com  Fact Sheet for Healthcare Providers:  seriousbroker.it  This test is no t yet approved or cleared by the [place]  FDA and  has been authorized for detection and/or diagnosis of SARS-CoV-2 by FDA under an Emergency Use Authorization (EUA). This EUA will remain  in effect (meaning this test can be used) for the duration of the COVID-19 declaration under Section 564(b)(1) of the Act, 21 U.S.C.section 360bbb-3(b)(1), unless the authorization is terminated  or revoked sooner.       Influenza A by PCR NEGATIVE NEGATIVE Final   Influenza B by PCR NEGATIVE NEGATIVE Final    Comment: (NOTE) The Xpert Xpress SARS-CoV-2/FLU/RSV plus assay is intended as an aid in the diagnosis of influenza from Nasopharyngeal swab specimens and should not be used as a sole basis for treatment. Nasal washings and aspirates are unacceptable for Xpert Xpress SARS-CoV-2/FLU/RSV testing.  Fact Sheet for Patients: bloggercourse.com  Fact Sheet for Healthcare Providers: seriousbroker.it  This test is not yet approved or cleared by the [place]  FDA and has been authorized for detection and/or diagnosis of SARS-CoV-2 by FDA under an Emergency Use Authorization (EUA). This EUA will  remain in effect (meaning this test can be used) for the duration of the COVID-19 declaration under Section 564(b)(1) of the Act, 21 U.S.C. section 360bbb-3(b)(1), unless the authorization is terminated or revoked.     Resp Syncytial Virus by PCR NEGATIVE NEGATIVE Final    Comment: (NOTE) Fact Sheet for Patients: bloggercourse.com  Fact Sheet for Healthcare Providers: seriousbroker.it  This test is not yet approved or cleared by the [place]  FDA and has been authorized for detection and/or diagnosis of SARS-CoV-2 by FDA under an Emergency Use Authorization (EUA). This EUA will remain in effect (meaning this test can be used) for the duration of the COVID-19 declaration under Section 564(b)(1) of the Act, 21 U.S.C. section 360bbb-3(b)(1), unless the authorization is terminated or revoked.  Performed at [facility]  [address], [address] [place], [place] [address]      Radiology Studies: MR BRAIN WO CONTRAST Result Date: 10/13/2024 CLINICAL DATA:  Stroke suspected EXAM: MRI HEAD WITHOUT CONTRAST TECHNIQUE: Multiplanar, multiecho pulse sequences of the brain and surrounding structures were obtained without intravenous contrast. COMPARISON:  None Available. FINDINGS: MRI brain: The brain volume is normal. There are confluent foci of T2 hyperintensity in the cerebral white matter. These do not have restricted diffusion. There is no acute or chronic infarct. The ventricles are normal. No mass lesion. There are normal flow signals in the carotid arteries and basilar artery. No significant bone marrow signal abnormality. No significant abnormality in the paranasal sinuses or soft tissues. IMPRESSION: There are multiple T2 hyperintensities within the cerebral white matter. These abnormalities are nonspecific and of uncertain etiology or clinical significance. They are more common in older patients and patients with risk factors for  vascular disease. No acute infarct. Electronically Signed   By: Nancyann Burns M.D.   On: 10/13/2024 15:26   DG Chest 2 View Result Date: 10/12/2024 CLINICAL DATA:  Dizziness and disorientation. EXAM: CHEST - 2 VIEW COMPARISON:  None Available. FINDINGS: Lungs are adequately inflated and otherwise clear. Cardiomediastinal silhouette is normal. Mild degenerative change of the spine. IMPRESSION: No active cardiopulmonary disease. Electronically Signed   By: Toribio Agreste M.D.   On: 10/12/2024 17:55   CT Head Wo Contrast Result Date: 10/12/2024 CLINICAL DATA:  Altered level of consciousness, dizziness, disorientation EXAM: CT HEAD WITHOUT CONTRAST TECHNIQUE: Contiguous axial images were obtained from the base of the skull through the vertex without intravenous contrast. RADIATION DOSE REDUCTION: This exam was performed according to the departmental dose-optimization program which includes automated exposure control, adjustment of the mA and/or kV according to patient size and/or use of iterative reconstruction technique. COMPARISON:  06/27/2020 FINDINGS: Brain: Confluent hypodensities are again seen throughout the periventricular white matter, compatible with chronic small vessel ischemic changes. No evidence of acute infarct or hemorrhage. Lateral ventricles and midline structures are unremarkable. No acute extra-axial fluid collections. No mass effect. Age-appropriate cerebral cortical atrophy. Vascular: No hyperdense vessel or unexpected calcification. Skull: Normal. Negative for fracture or focal lesion. Sinuses/Orbits: No acute finding. Other: None. IMPRESSION: 1. No acute intracranial process. Electronically Signed   By: Ozell Daring M.D.   On: 10/12/2024 17:55    Scheduled Meds:  aspirin  EC  81 mg Oral QHS   cyanocobalamin   2,000 mcg Oral QHS   fluticasone  furoate-vilanterol  1 puff Inhalation Daily   loratadine   10 mg Oral Daily   Rivaroxaban   15 mg Oral Q supper   rosuvastatin   10 mg Oral  Daily   sertraline   25 mg Oral Daily   umeclidinium bromide   1 puff Inhalation Daily   Continuous Infusions:   LOS: 0 days   Fredia Skeeter, MD [facility]  10/14/2024, 11:43 AM   *Please note that this is a verbal dictation therefore any spelling or grammatical errors are due to the Dragon Medical One system interpretation.  Please page via Amion and do not message via secure chat for urgent patient care matters. Secure chat can be used for non urgent patient care matters.  How to contact the [facility] Attending or Consulting provider 7A - 7P or covering provider during after hours 7P -7A, for this patient?  Check the care team in [facility] and look for a) attending/consulting [facility] provider listed and b) the [facility] team listed. Page or secure chat 7A-7P. Log into  [email or web address] and use [place]'s universal password to access. If you do not have the password, please contact the hospital operator. Locate the [facility] provider you are looking for under [facility] and page to a number that you can be directly reached. If you still have difficulty reaching the provider, please page the [facility] (Director on Call) for the Hospitalists listed on amion for assistance.

## 2024-10-14 NOTE — Plan of Care (Signed)

## 2024-10-14 NOTE — Procedures (Signed)
 Patient Name: Caleb Santana  MRN: [id number]  Epilepsy Attending: Arlin MALVA Krebs  Referring Physician/Provider: Georgina Basket, MD  Date: 10/14/2024 Duration: 22.21 mins  Patient history: 89yo  with acute onset dizziness/ lightheadedness, confusion, and difficulty with speech. EEG to evaluate for seizure   Level of alertness: Awake  AEDs during EEG study: None  Technical aspects: This EEG study was done with scalp electrodes positioned according to the 10-20 International system of electrode placement. Electrical activity was reviewed with band pass filter of 1-70Hz , sensitivity of 7 uV/mm, display speed of 15mm/sec with a 60Hz  notched filter applied as appropriate. EEG data were recorded continuously and digitally stored.  Video monitoring was available and reviewed as appropriate.  Description: The posterior dominant rhythm consists of 7 Hz activity of moderate voltage (25-35 uV) seen predominantly in posterior head regions, symmetric and reactive to eye opening and eye closing. Hyperventilation and photic stimulation were not performed.     ABNORMALITY - Background slow  IMPRESSION: This study is suggestive of generalized cerebral dysfunction (encephalopathy). No seizures or epileptiform discharges were seen throughout the recording.  Caleb Santana

## 2024-10-14 NOTE — Plan of Care (Signed)
   Problem: Education: Goal: Knowledge of General Education information will improve Description: Including pain rating scale, medication(s)/side effects and non-pharmacologic comfort measures Outcome: Progressing   Problem: Clinical Measurements: Goal: Cardiovascular complication will be avoided Outcome: Progressing

## 2024-10-14 NOTE — Progress Notes (Signed)
 EEG complete - results pending

## 2024-10-15 ENCOUNTER — Observation Stay (HOSPITAL_COMMUNITY)

## 2024-10-15 DIAGNOSIS — Z7951 Long term (current) use of inhaled steroids: Secondary | ICD-10-CM | POA: Diagnosis not present

## 2024-10-15 DIAGNOSIS — N1831 Chronic kidney disease, stage 3a: Secondary | ICD-10-CM | POA: Diagnosis present

## 2024-10-15 DIAGNOSIS — G459 Transient cerebral ischemic attack, unspecified: Secondary | ICD-10-CM

## 2024-10-15 DIAGNOSIS — G9349 Other encephalopathy: Secondary | ICD-10-CM | POA: Diagnosis present

## 2024-10-15 DIAGNOSIS — R404 Transient alteration of awareness: Secondary | ICD-10-CM | POA: Diagnosis present

## 2024-10-15 DIAGNOSIS — Z79899 Other long term (current) drug therapy: Secondary | ICD-10-CM | POA: Diagnosis not present

## 2024-10-15 DIAGNOSIS — R4781 Slurred speech: Secondary | ICD-10-CM | POA: Diagnosis present

## 2024-10-15 DIAGNOSIS — Z8673 Personal history of transient ischemic attack (TIA), and cerebral infarction without residual deficits: Secondary | ICD-10-CM | POA: Diagnosis not present

## 2024-10-15 DIAGNOSIS — J449 Chronic obstructive pulmonary disease, unspecified: Secondary | ICD-10-CM | POA: Diagnosis present

## 2024-10-15 DIAGNOSIS — H538 Other visual disturbances: Secondary | ICD-10-CM | POA: Diagnosis present

## 2024-10-15 DIAGNOSIS — Z1152 Encounter for screening for COVID-19: Secondary | ICD-10-CM | POA: Diagnosis not present

## 2024-10-15 DIAGNOSIS — H547 Unspecified visual loss: Secondary | ICD-10-CM | POA: Diagnosis present

## 2024-10-15 DIAGNOSIS — R001 Bradycardia, unspecified: Secondary | ICD-10-CM | POA: Diagnosis present

## 2024-10-15 DIAGNOSIS — I129 Hypertensive chronic kidney disease with stage 1 through stage 4 chronic kidney disease, or unspecified chronic kidney disease: Secondary | ICD-10-CM | POA: Diagnosis present

## 2024-10-15 DIAGNOSIS — Z87891 Personal history of nicotine dependence: Secondary | ICD-10-CM | POA: Diagnosis not present

## 2024-10-15 DIAGNOSIS — Z86711 Personal history of pulmonary embolism: Secondary | ICD-10-CM | POA: Diagnosis not present

## 2024-10-15 DIAGNOSIS — Z7982 Long term (current) use of aspirin: Secondary | ICD-10-CM | POA: Diagnosis not present

## 2024-10-15 DIAGNOSIS — G20A1 Parkinson's disease without dyskinesia, without mention of fluctuations: Secondary | ICD-10-CM | POA: Diagnosis present

## 2024-10-15 DIAGNOSIS — Z7901 Long term (current) use of anticoagulants: Secondary | ICD-10-CM | POA: Diagnosis not present

## 2024-10-15 LAB — ECHOCARDIOGRAM COMPLETE
AR max vel: 4.13 cm2
AV Peak grad: 2.3 mmHg
Ao pk vel: 0.76 m/s
Area-P 1/2: 2.91 cm2
S' Lateral: 2.6 cm

## 2024-10-15 LAB — PROTIME-INR
INR: 1.6 — ABNORMAL HIGH (ref 0.8–1.2)
Prothrombin Time: 19.9 s — ABNORMAL HIGH (ref 11.4–15.2)

## 2024-10-15 MED ORDER — SODIUM CHLORIDE 0.9 % IV BOLUS
1000.0000 mL | Freq: Once | INTRAVENOUS | Status: AC
  Administered 2024-10-15: 1000 mL via INTRAVENOUS

## 2024-10-15 MED ORDER — SODIUM CHLORIDE 0.9 % IV SOLN: INTRAVENOUS | Status: AC

## 2024-10-15 NOTE — Progress Notes (Signed)
°   10/15/24 2035  Assess: MEWS Score  BP (!) 202/88  MAP (mmHg) 119  Pulse Rate 73  Resp 17  SpO2 96 %  O2 Device Room Air  Assess: MEWS Score  MEWS Temp 0  MEWS Systolic 2  MEWS Pulse 0  MEWS RR 0  MEWS LOC 0  MEWS Score 2  MEWS Score Color Yellow  Assess: if the MEWS score is Yellow or Red  Were vital signs accurate and taken at a resting state? Yes  Does the patient meet 2 or more of the SIRS criteria? No  MEWS guidelines implemented  Yes, yellow  Treat  MEWS Interventions Considered administering scheduled or prn medications/treatments as ordered  Take Vital Signs  Increase Vital Sign Frequency  Yellow: Q2hr x1, continue Q4hrs until patient remains green for 12hrs  Escalate  MEWS: Escalate Yellow: Discuss with charge nurse and consider notifying provider and/or RRT  Notify: Charge Nurse/RN  Name of Charge Nurse/RN Notified Roselie, RN  Provider Notification  Provider Name/Title Marcene, MD  Date Provider Notified 10/15/24  Time Provider Notified 1945  Method of Notification Page  Notification Reason Other (Comment)  Provider response See new orders  Date of Provider Response 10/15/24  Time of Provider Response 2155  Assess: SIRS CRITERIA  SIRS Temperature  0  SIRS Respirations  0  SIRS Pulse 0  SIRS WBC 0  SIRS Score Sum  0   NS rate reduced

## 2024-10-15 NOTE — Progress Notes (Signed)
 Echocardiogram 2D Echocardiogram has been performed.  Kynslie Ringle N Keishon Chavarin,RDCS 10/15/2024, 8:11 AM

## 2024-10-15 NOTE — Progress Notes (Signed)
 Pt BP 202/88 tonight, hydralazine  PRN given, BP 155/88. Pt asymptomatic. Today pt positive for orthostatic BP and started on fluids Normal Saline at 150ml/hr.  Howerter, MD made aware.  Fluids reduced per MD order.

## 2024-10-15 NOTE — Evaluation (Signed)
 Occupational Therapy Evaluation Patient Details Name: Caleb Santana MRN: [id number] DOB: 09/22/1935 Today's Date: 10/15/2024   History of Present Illness   89 y.o. male admitted 10/12/24 with episode of dizziness, confusion and speech difficulty while out shopping with wife. Head CT negative for acute injury. Workup for TIA. PMH includes HTN, CVA, CKD IIIa, COPD, PE, chronic dizziness.     Clinical Impressions At baseline, pt is Independent with ADLs, IADLs, and functional mobility without an AD. Pt drives and works with a systems analyst 3x per week. Pt now presents with impaired vision (see below for details), impaired cardiopulmonary status, increased dizziness with standing and with moving head to the Left (in sitting and standing), mildly decreased topographical orientation, and decreased safety and independence with functional tasks. Pt with episode of orthostatic hypotension in standing with dizziness this session with HR and O2 sat stable and WNL. Pt currently demonstrating ability to complete ADLs largely Independent to Contact guard assist and functional transfers/mobility with a RW with close Supervision to Contact guard assist for safety. Pt participated well in session, is motivated to return to PLOF, and has good family support. Pt will benefit from acute OT services to address deficits and increase safety and independence with functional tasks. Post acute discharge, pt will benefit from outpatient neuro rehab.   BP in supine: 173/80; HR: 82 bpm BP in sitting: 163/86; HR: 84 bpm BP in standing: 125/61; HR: 89 bpm BP in standing at 3 min: 125/61; HR: 91 bpm BP in sitting after ambulating 30': 144/72; HR 82 bpm   If plan is discharge home, recommend the following:   A little help with walking and/or transfers;A little help with bathing/dressing/bathroom;Assistance with cooking/housework;Assist for transportation;Help with stairs or ramp for entrance     Functional Status  Assessment   Patient has had a recent decline in their functional status and demonstrates the ability to make significant improvements in function in a reasonable and predictable amount of time.     Equipment Recommendations   None recommended by OT     Recommendations for Other Services         Precautions/Restrictions   Precautions Precautions: Fall;Other (comment) Recall of Precautions/Restrictions: Intact Precaution/Restrictions Comments: symptomatic orthostatic hypotension with standing Restrictions Weight Bearing Restrictions Per Provider Order: No     Mobility Bed Mobility Overal bed mobility: Needs Assistance Bed Mobility: Supine to Sit, Sit to Supine     Supine to sit: Modified independent (Device/Increase time), Supervision Sit to supine: Modified independent (Device/Increase time), Supervision   General bed mobility comments: Recommend Supervision for safety due to pt reports of dizziness while turing head during bed mobility.    Transfers Overall transfer level: Needs assistance Equipment used: None, Rolling walker (2 wheels) Transfers: Sit to/from Stand, Bed to chair/wheelchair/BSC Sit to Stand: Supervision     Step pivot transfers: Contact guard assist     General transfer comment: Supervision to CGA for safety due to dizziness and episode of orthostatic hypotension during session      Balance Overall balance assessment: Needs assistance Sitting-balance support: Single extremity supported, No upper extremity supported, Feet supported Sitting balance-Leahy Scale: Good     Standing balance support: No upper extremity supported, Single extremity supported, Bilateral upper extremity supported, During functional activity Standing balance-Leahy Scale: Fair                             ADL either performed or assessed with clinical judgement  ADL Overall ADL's : Needs assistance/impaired Eating/Feeding: Independent;Sitting    Grooming: Contact guard assist;Standing   Upper Body Bathing: Set up;Supervision/ safety;Sitting   Lower Body Bathing: Supervison/ safety;Sitting/lateral leans;Contact guard assist;Sit to/from stand   Upper Body Dressing : Set up;Sitting   Lower Body Dressing: Supervision/safety;Sitting/lateral leans;Contact guard assist;Sit to/from stand   Toilet Transfer: Contact guard assist;Ambulation;Regular Toilet;Rolling walker (2 wheels) Toilet Transfer Details (indicate cue type and reason): simulated bed<->chair Toileting- Clothing Manipulation and Hygiene: Supervision/safety;Sitting/lateral lean       Functional mobility during ADLs: Contact guard assist;Rolling walker (2 wheels)       Vision Patient Visual Report: Other (comment) (Pt and family report long-standing vision issues, 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 Due to issues with long-standing eye issues and current contact lenses, pt has not been able to wear his contacts for about a week.) Vision Assessment?: Yes Eye Alignment: Within Functional Limits Ocular Range of Motion: Within Functional Limits Alignment/Gaze Preference: Within Defined Limits Tracking/Visual Pursuits: Decreased smoothness of eye movement to LEFT superior field;Requires cues, head turns, or add eye shifts to track;Decreased smoothness of horizontal tracking (Decreased smoothness of horizontal tracking in Left eye) Saccades: Additional eye shifts occurred during testing;Additional head turns occurred during testing;Undershoots (additional eye shifts in Left eye; mildly undershoots when reaching for objects placed on Left and when using Left hand to touch targets in all planes) Convergence: Other (comment) (decreased speed in Left eye) Visual Fields: Right visual field deficit;Left visual field deficit;Impaired-to be further tested in functional context Diplopia Assessment: Other (comment) (none) Depth Perception: Undershoots Additional Comments: Pt with  increased dizziness when moving Left ear toward Left shoulder or turning head quickly to the Left. Pt reports brief episode of not being able to see anything during TIA leading to this admission. Pt reports he feels vision is back to baseline, but he and family also report no known issues with tracking objects, dizziness with head turns, depth perception, or topographical orientation.     Perception Perception: Impaired Preception Impairment Details: Topographical orientation Perception-Other Comments: Pt requiring min cues to locate his room after turning Left out of room and walking approximately 15' and turning 180 degrees to return to room.   Praxis Praxis: WFL       Pertinent Vitals/Pain Pain Assessment Pain Assessment: No/denies pain     Extremity/Trunk Assessment Upper Extremity Assessment Upper Extremity Assessment: Right hand dominant;Overall [facility] for tasks assessed   Lower Extremity Assessment Lower Extremity Assessment: Defer to PT evaluation   Cervical / Trunk Assessment Cervical / Trunk Assessment: Normal   Communication Communication Communication: Impaired Factors Affecting Communication: Hearing impaired   Cognition Arousal: Alert Behavior During Therapy: WFL for tasks assessed/performed Cognition: No apparent impairments             OT - Cognition Comments: Pt AAOX4 with cognition WFL. However, pt demonstrating mild difficulty with topographical orientation, requiring min cues to locate room after walking approx 15' in the hallway with OT.                 Following commands: Intact       Cueing  General Comments   Cueing Techniques: Verbal cues  Pt's wife and son present and supportive throughout session. Pt with episode of orthostatic hypotension during session. HR and O2 stable on RA throughout session. RN present during a portion of session and taking pt orthos upon OT arrival.   Exercises     Shoulder Instructions      Home Living  Family/patient expects to be discharged to:: Private residence Living Arrangements: Spouse/significant other Available Help at Discharge: Family;Available 24 hours/day Type of Home: House Home Access: Level entry     Home Layout: One level     Bathroom Shower/Tub: Producer, Television/film/video: Standard     Home Equipment: Pharmacist, Hospital (2 wheels)   Additional Comments: wife reports plan to buy pt a Rollator per PCP recommendation      Prior Functioning/Environment Prior Level of Function : Independent/Modified Independent;Driving             Mobility Comments: At baseline, pt is Independent with functional mobility without an AD. Pt and wife report pt has had several brief episodes of feeling dizzy or staring off, especially while standing or going up/down steps, over the past 5-6 months. When thses episodes happen, pt steadies himself on his wife, the wall, or a railing. ADLs Comments: At baseline, pt is Independent with ADL/IADLs including med management and driving; however, pt's wife has been doing more driving lately due to pt being unable to wear contact lens (related to long-standing eye condition). Pt is very active and goes to the gym and works with a personal traininer 3x a week.    OT Problem List: Decreased activity tolerance;Impaired balance (sitting and/or standing);Decreased knowledge of precautions;Decreased knowledge of use of DME or AE;Impaired vision/perception   OT Treatment/Interventions: Visual/perceptual remediation/compensation;Self-care/ADL training;DME and/or AE instruction;Energy conservation;Patient/family education;Balance training      OT Goals(Current goals can be found in the care plan section)   Acute Rehab OT Goals Patient Stated Goal: to return home OT Goal Formulation: With patient/family Time For Goal Achievement: 10/29/24 Potential to Achieve Goals: Good ADL Goals Pt Will Perform Grooming: with modified  independence;standing Pt Will Perform Lower Body Dressing: with modified independence;sitting/lateral leans;sit to/from stand Pt Will Transfer to Toilet: with modified independence;ambulating;regular height toilet (with least restrictive AD) Additional ADL Goal #1: Patient will demonstrate ability to Independently participate in home exercise program to improve visual pursuits with handout provided for increased safety and independence with functional tasks. Additional ADL Goal #2: Patient will demonstrate ability to perform functional and therapeutic orientation activities outside of room with Mod I with use of least restrictive AD to increase safety and independence in the home and community.   OT Frequency:  Min 2X/week    Co-evaluation              AM-PAC OT 6 Clicks Daily Activity     Outcome Measure Help from another person eating meals?: None Help from another person taking care of personal grooming?: A Little Help from another person toileting, which includes using toliet, bedpan, or urinal?: A Little Help from another person bathing (including washing, rinsing, drying)?: A Little Help from another person to put on and taking off regular upper body clothing?: A Little Help from another person to put on and taking off regular lower body clothing?: A Little 6 Click Score: 19   End of Session Equipment Utilized During Treatment: Gait belt;Rolling walker (2 wheels) Nurse Communication: Mobility status;Other (comment) (Vision assessment results; pt reporting increased dizziness with head movements to the Left; pt with mildly decreased topographical orientation, requiring cues to find room)  Activity Tolerance: Treatment limited secondary to medical complications (Comment);Patient tolerated treatment well Patient left: in bed;with bed alarm set;with family/visitor present  OT Visit Diagnosis: Other abnormalities of gait and mobility (R26.89)  Time: 8387-8342 OT  Time Calculation (min): 45 min Charges:  OT General Charges $OT Visit: 1 Visit OT Evaluation $OT Eval Moderate Complexity: 1 Mod OT Treatments $Self Care/Home Management : 8-22 mins $Therapeutic Activity: 8-22 mins  Margarie Rockey HERO., OTR/L, MA Acute Rehab [phone]   Margarie FORBES Horns 10/15/2024, 5:51 PM

## 2024-10-15 NOTE — Evaluation (Signed)
 Physical Therapy Evaluation Patient Details Name: Caleb Santana MRN: [id number] DOB: 1935/05/07 Today's Date: 10/15/2024  History of Present Illness  89 y.o. male admitted 10/12/24 with episode of dizziness, confusion and speech difficulty while out shopping with wife. Head CT negative for acute injury. Workup for TIA. PMH includes HTN, CVA, CKD IIIa, COPD, PE, chronic dizziness.   Clinical Impression  Pt presents with an overall decrease in functional mobility secondary to above. 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 Today, pt moving fairly well, though limited by significant orthostatic hypotension with pre-syncopal symptoms every time he stands; MD notified. Expect pt to progress well once this is resolved, suspect no follow up PT needs. Will follow acutely to address established goals.     Orthostatic BPs Supine 148/77  Sitting 133/65  Standing 76/59  Sitting w/ arm pumps 101/62  Standing 65/44  Return to supine 131/74       If plan is discharge home, recommend the following: Assistance with cooking/housework;Assist for transportation;Help with stairs or ramp for entrance   Can travel by private vehicle    Yes    Equipment Recommendations None recommended by PT  Recommendations for Other Services       Functional Status Assessment Patient has had a recent decline in their functional status and demonstrates the ability to make significant improvements in function in a reasonable and predictable amount of time.     Precautions / Restrictions Precautions Precautions: Fall;Other (comment) Recall of Precautions/Restrictions: Intact Precaution/Restrictions Comments: symptomatic orthostatic hypotension with standing Restrictions Weight Bearing Restrictions Per Provider Order: No      Mobility  Bed Mobility Overal bed mobility: Modified Independent             General bed mobility comments:  multiple supine<>sit with HOB slightly elevated, mod indep with use of bed rail    Transfers Overall transfer level: Needs assistance Equipment used: None Transfers: Sit to/from Stand Sit to Stand: Supervision           General transfer comment: multiple sit<>stands from EOB without DME, supervision for safety    Ambulation/Gait Ambulation/Gait assistance: Contact guard assist Gait Distance (Feet): 4 Feet Assistive device: None         General Gait Details: pt able to take side steps and steps forwards/backwards without DME, CGA for safety and further distance limited secondary to pre-syncope and hypotension  Stairs            Wheelchair Mobility     Tilt Bed    Modified Rankin (Stroke Patients Only) Modified Rankin (Stroke Patients Only) Pre-Morbid Rankin Score: No symptoms Modified Rankin: Moderate disability     Balance Overall balance assessment: Needs assistance   Sitting balance-Leahy Scale: Good Sitting balance - Comments: indep to don/doff shoes sitting EOB   Standing balance support: No upper extremity supported, During functional activity Standing balance-Leahy Scale: Fair                               Pertinent Vitals/Pain Pain Assessment Pain Assessment: No/denies pain    Home Living Family/patient expects to be discharged to:: Private residence Living Arrangements: Spouse/significant other Available Help at Discharge: Family;Available 24 hours/day Type of Home: House Home Access: Level entry       Home Layout: One level Home Equipment: Shower seat      Prior Function Prior Level of Function : Independent/Modified Independent;Driving  Mobility Comments: indep without DME, 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/strengthening, drives (wife has done majority of driving recently) ADLs Comments: indep with ADL/iADLs including med management, wife able to assist as needed     Extremity/Trunk  Assessment   Upper Extremity Assessment Upper Extremity Assessment: Overall WFL for tasks assessed    Lower Extremity Assessment Lower Extremity Assessment: Generalized weakness       Communication   Communication Communication: Impaired Factors Affecting Communication: Hearing impaired    Cognition Arousal: Alert Behavior During Therapy: WFL for tasks assessed/performed   PT - Cognitive impairments: No apparent impairments                         Following commands: Intact       Cueing Cueing Techniques: Verbal cues     General Comments General comments (skin integrity, edema, etc.): wife present and supportive. multiple rounds of BP measurements taken with BP from 148/77 supine as low as 65/44 standing (MD and RN notified), pt with presyncopal symptoms requiring return to sit. educ pt and wife regarding strategies to overcome orthsotatic hypotension signs/symptoms (i.e. arm pumps, ted hose, increased time with position changes)    Exercises     Assessment/Plan    PT Assessment Patient needs continued PT services  PT Problem List Decreased strength;Decreased activity tolerance;Decreased balance;Decreased mobility;Cardiopulmonary status limiting activity       PT Treatment Interventions DME instruction;Gait training;Stair training;Functional mobility training;Therapeutic activities;Therapeutic exercise;Balance training;Patient/family education    PT Goals (Current goals can be found in the Care Plan section)  Acute Rehab PT Goals Patient Stated Goal: improved dizziness PT Goal Formulation: With patient/family Time For Goal Achievement: 10/29/24 Potential to Achieve Goals: Good    Frequency Min 1X/week     Co-evaluation               AM-PAC PT 6 Clicks Mobility  Outcome Measure Help needed turning from your back to your side while in a flat bed without using bedrails?: None Help needed moving from lying on your back to sitting on the side of  a flat bed without using bedrails?: None Help needed moving to and from a bed to a chair (including a wheelchair)?: A Little Help needed standing up from a chair using your arms (e.g., wheelchair or bedside chair)?: A Little Help needed to walk in hospital room?: A Little Help needed climbing 3-5 steps with a railing? : A Little 6 Click Score: 20    End of Session Equipment Utilized During Treatment: Gait belt Activity Tolerance: Treatment limited secondary to medical complications (Comment) Patient left: in bed;with call bell/phone within reach;with bed alarm set;with family/visitor present Nurse Communication: Mobility status;Other (comment) (orthostatics) PT Visit Diagnosis: Other abnormalities of gait and mobility (R26.89);Dizziness and giddiness (R42)    Time: 9164-9099 PT Time Calculation (min) (ACUTE ONLY): 25 min   Charges:   PT Evaluation $PT Eval Moderate Complexity: 1 Mod PT Treatments $Therapeutic Activity: 8-22 mins PT General Charges $$ ACUTE PT VISIT: 1 Visit        Darice Almas, PT, DPT Acute Rehabilitation Services  Personal: Secure Chat Rehab Office: [phone]   Darice LITTIE Almas 10/15/2024, 10:59 AM

## 2024-10-15 NOTE — Progress Notes (Signed)
 [facility] night cross cover note:   I was notified by the patient's RN that this patient, who is currently on normal saline at 150 cc/h in the setting of previous hypotension as well as orthostatic hypotension, had a blood pressure this evening of 202/88, with ensuing decrease to 155/88 following dose of as needed hydralazine .  I subsequently reduced rate of his normal saline to 100 cc/h, with plan to continue to monitor ensuing vital sign trend.     Eva Pore, DO Hospitalist

## 2024-10-15 NOTE — Progress Notes (Signed)
 PROGRESS NOTE    Caleb Santana  FMW:[id number] DOB: [birth year] DOA: 10/12/2024 PCP: Delilah Murray HERO., MD   Brief Narrative:  HPI: Caleb Santana is a 89 y.o. male with medical history significant of  HTN, CVA, CKD stage IIIa presenting for evaluation of acute onset dizziness/lightheadedness, confusion, and difficulty with speech while he was shopping, but is now back to baseline; thus, pt admitted for TIA evaluation.   The patient experienced confusion and difficulty communicating, which began around 4:15p. The patient reported being unable to concentrate and having trouble using coherent speech. The patient's family mentioned that he was shopping earlier in the day. The patient has never experienced similar symptoms before. However, over the past few years, the family noticed episodes characterized by a blank look and unsteadiness, requiring assistance to prevent falling. These episodes typically lasted about thirty minutes, after which the patient returned to normal Of note, the pt's neurologist had planned an EEG to eval epilepsy, but this has not been completed as of yet. During the most recent episode, the patient had difficulties using the car door due to impaired vision, but no falls were reported. The patient has a history of stroke around twenty years ago, which affected his eye but not in a similar manner to the current episode.   In the ED, pt bradycardic and hypertensive. EKG showing sinus rhythm and no obvious STEMI. Troponin 26-->27. No chest pain reported.  CT head showing no acute intracranial process.  Chest x-ray showing no active cardiopulmonary disease.  UA not suggestive of infection.  Ethanol level <15.  Not hypoglycemic.  500 mL IV fluid bolus given.  Admission requested for brain MRI due to concern for possible TIA.    Assessment & Plan:   Principal Problem:   TIA (transient ischemic attack)   Acute encephalopathy and strokelike symptoms/likely TIA, now resolved I was able to  gather more history from the wife, she tells me that patient has been suffering from dizziness for 20 years, more so since about 1 year and even more since about a month when along with dizziness, he is fainting, needing the family members to hold onto him.  However on Thursday, patient had dizziness, fainting and he was confused with slurred speech as well as blurred vision and those symptoms were resolved by the time he arrived to the ED.  Other than dizziness, patient has no other complaint at the moment.  Stroke ruled out with MRI negative.  However patient's symptoms are concerning for possible TIA.  Echo was completed, result pending.  EEG negative for epileptiform activity.  Seen by PT, was found to have significant orthostasis.  Patient was symptomatic (down to 65/44 standing with near syncope).  We are going to give him 1 L of IV fluid bolus, recheck orthostatic later, keep him overnight with gentle IV hydration, repeat orthostatics in the morning again.  Discontinuing amlodipine .  Sinus bradycardia: Patient was noted to have mild sinus bradycardia as well.  I do not think this mild degree of bradycardia is the reason for his symptoms as his bradycardia has resolved but he is still symptomatic.  Monitor on telemetry.  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    COPD -PTA Breo Ellipta  daily, umeclidnium inh daily, and albuterol  inh prn  DVT prophylaxis: Xarelto    Code Status: Full Code  Family  Communication: Wife present at bedside.  Plan of care discussed with patient in length and he/she verbalized understanding and agreed with it.  Status is: Observation The patient will require care spanning > 2 midnights and should be moved to inpatient because: Needs echo, evaluation by PT OT,   Estimated body mass index is 29.86 kg/m as calculated from the  following:   Height as of 06/27/20: 5' 6 (1.676 m).   Weight as of 06/27/20: 83.9 kg.    Nutritional Assessment: There is no height or weight on file to calculate BMI.. Seen by dietician.  I agree with the assessment and plan as outlined below: Nutrition Status:        . Skin Assessment: I have examined the patient's skin and I agree with the wound assessment as performed by the wound care RN as outlined below:    Consultants:  None  Procedures:  None  Antimicrobials:  Anti-infectives (From admission, onward)    None         Subjective: Patient seen and examined, per him, he had no complaint of dizziness until he worked with PT and then he felt dizzy.  No other complaint.  Wife concerned about confusion however it has not been noted by the nurses or by myself.  I recommended outpatient follow-up with neurology for cognitive testing for dementia.  Objective: Vitals:   10/14/24 2030 10/15/24 0010 10/15/24 0403 10/15/24 0822  BP: (!) 136/58 (!) 145/45 135/86   Pulse: 83  80 76  Resp: 16 16 16 18   Temp: 98.2 F (36.8 C) 98.3 F (36.8 C) 98.7 F (37.1 C)   TempSrc: Oral Oral Oral   SpO2: 96% 96% 98%    No intake or output data in the 24 hours ending 10/15/24 1059 There were no vitals filed for this visit.  Examination:  General exam: Appears calm and comfortable  Respiratory system: Clear to auscultation. Respiratory effort normal. Cardiovascular system: S1 & S2 heard, RRR. No JVD, murmurs, rubs, gallops or clicks. No pedal edema. Gastrointestinal system: Abdomen is nondistended, soft and nontender. No organomegaly or masses felt. Normal bowel sounds heard. Central nervous system: Alert and oriented. No focal neurological deficits. Extremities: Symmetric 5 x 5 power. Skin: No rashes, lesions or ulcers.  Psychiatry: Judgement and insight appear normal. Mood & affect appropriate.   Data Reviewed: I have personally reviewed following labs and imaging  studies  CBC: Recent Labs  Lab 10/12/24 1824 10/13/24 0915 10/14/24 1005  WBC 9.0 8.1 8.0  NEUTROABS 7.0  --  6.3  HGB 13.0 12.8* 13.1  HCT 38.4* 37.9* 38.4*  MCV 90.8 90.9 90.4  PLT 189 195 190   Basic Metabolic Panel: Recent Labs  Lab 10/12/24 1824 10/13/24 0915  NA 137  --   K 4.2  --   CL 100  --   CO2 25  --   GLUCOSE 98  --   BUN 20  --   CREATININE 1.21 1.12  CALCIUM  9.2  --    GFR: CrCl cannot be calculated (Unknown ideal weight.). Liver Function Tests: Recent Labs  Lab 10/12/24 1824  AST 14*  ALT 10  ALKPHOS 46  BILITOT 0.7  PROT 6.9  ALBUMIN 4.4   No results for input(s): LIPASE, AMYLASE in the last 168 hours. No results for input(s): AMMONIA in the last 168 hours. Coagulation Profile: Recent Labs  Lab 10/12/24 1823 10/13/24 0507 10/14/24 0847 10/15/24 0653  INR 1.7* 1.3* 1.9* 1.6*   Cardiac Enzymes: No  results for input(s): CKTOTAL, CKMB, CKMBINDEX, TROPONINI in the last 168 hours. BNP (last 3 results) No results for input(s): PROBNP in the last 8760 hours. HbA1C: No results for input(s): HGBA1C in the last 72 hours. CBG: Recent Labs  Lab 10/12/24 1753  GLUCAP 94   Lipid Profile: No results for input(s): CHOL, HDL, LDLCALC, TRIG, CHOLHDL, LDLDIRECT in the last 72 hours. Thyroid Function Tests: No results for input(s): TSH, T4TOTAL, FREET4, T3FREE, THYROIDAB in the last 72 hours. Anemia Panel: No results for input(s): VITAMINB12, FOLATE, FERRITIN, TIBC, IRON, RETICCTPCT in the last 72 hours. Sepsis Labs: No results for input(s): PROCALCITON, LATICACIDVEN in the last 168 hours.  Recent Results (from the past 240 hours)  Resp panel by RT-PCR (RSV, Flu A&B, Covid) Anterior Nasal Swab     Status: None   Collection Time: 10/12/24  5:23 PM   Specimen: Anterior Nasal Swab  Result Value Ref Range Status   SARS Coronavirus 2 by RT PCR NEGATIVE NEGATIVE Final    Comment:  (NOTE) SARS-CoV-2 target nucleic acids are NOT DETECTED.  The SARS-CoV-2 RNA is generally detectable in upper respiratory specimens during the acute phase of infection. The lowest concentration of SARS-CoV-2 viral copies this assay can detect is 138 copies/mL. A negative result does not preclude SARS-Cov-2 infection and should not be used as the sole basis for treatment or other patient management decisions. A negative result may occur with  improper specimen collection/handling, submission of specimen other than nasopharyngeal swab, presence of viral mutation(s) within the areas targeted by this assay, and inadequate number of viral copies(<138 copies/mL). A negative result must be combined with clinical observations, patient history, and epidemiological information. The expected result is Negative.  Fact Sheet for Patients:  bloggercourse.com  Fact Sheet for Healthcare Providers:  seriousbroker.it  This test is no t yet approved or cleared by the [place]  FDA and  has been authorized for detection and/or diagnosis of SARS-CoV-2 by FDA under an Emergency Use Authorization (EUA). This EUA will remain  in effect (meaning this test can be used) for the duration of the COVID-19 declaration under Section 564(b)(1) of the Act, 21 U.S.C.section 360bbb-3(b)(1), unless the authorization is terminated  or revoked sooner.       Influenza A by PCR NEGATIVE NEGATIVE Final   Influenza B by PCR NEGATIVE NEGATIVE Final    Comment: (NOTE) The Xpert Xpress SARS-CoV-2/FLU/RSV plus assay is intended as an aid in the diagnosis of influenza from Nasopharyngeal swab specimens and should not be used as a sole basis for treatment. Nasal washings and aspirates are unacceptable for Xpert Xpress SARS-CoV-2/FLU/RSV testing.  Fact Sheet for Patients: bloggercourse.com  Fact Sheet for Healthcare  Providers: seriousbroker.it  This test is not yet approved or cleared by the [place]  FDA and has been authorized for detection and/or diagnosis of SARS-CoV-2 by FDA under an Emergency Use Authorization (EUA). This EUA will remain in effect (meaning this test can be used) for the duration of the COVID-19 declaration under Section 564(b)(1) of the Act, 21 U.S.C. section 360bbb-3(b)(1), unless the authorization is terminated or revoked.     Resp Syncytial Virus by PCR NEGATIVE NEGATIVE Final    Comment: (NOTE) Fact Sheet for Patients: bloggercourse.com  Fact Sheet for Healthcare Providers: seriousbroker.it  This test is not yet approved or cleared by the [place]  FDA and has been authorized for detection and/or diagnosis of SARS-CoV-2 by FDA under an Emergency Use Authorization (EUA). This EUA will remain in effect (meaning this test  can be used) for the duration of the COVID-19 declaration under Section 564(b)(1) of the Act, 21 U.S.C. section 360bbb-3(b)(1), unless the authorization is terminated or revoked.  Performed at [facility], [address] [place], [place] [address]      Radiology Studies: EEG adult Result Date: 10/14/2024 Shelton Arlin KIDD, MD     10/14/2024  3:32 PM Patient Name: Caleb Santana MRN: [id number] Epilepsy Attending: Arlin KIDD Shelton Referring Physician/Provider: Georgina Basket, MD Date: 10/14/2024 Duration: 22.21 mins Patient history: 89yo  with acute onset dizziness/ lightheadedness, confusion, and difficulty with speech. EEG to evaluate for seizure Level of alertness: Awake AEDs during EEG study: None Technical aspects: This EEG study was done with scalp electrodes positioned according to the 10-20 International system of electrode placement. Electrical activity was reviewed with band pass filter of 1-70Hz , sensitivity of 7 uV/mm, display speed of 22mm/sec with a  60Hz  notched filter applied as appropriate. EEG data were recorded continuously and digitally stored.  Video monitoring was available and reviewed as appropriate. Description: The posterior dominant rhythm consists of 7 Hz activity of moderate voltage (25-35 uV) seen predominantly in posterior head regions, symmetric and reactive to eye opening and eye closing. Hyperventilation and photic stimulation were not performed.   ABNORMALITY - Background slow IMPRESSION: This study is suggestive of generalized cerebral dysfunction (encephalopathy). No seizures or epileptiform discharges were seen throughout the recording. Arlin KIDD Shelton   MR BRAIN WO CONTRAST Result Date: 10/13/2024 CLINICAL DATA:  Stroke suspected EXAM: MRI HEAD WITHOUT CONTRAST TECHNIQUE: Multiplanar, multiecho pulse sequences of the brain and surrounding structures were obtained without intravenous contrast. COMPARISON:  None Available. FINDINGS: MRI brain: The brain volume is normal. There are confluent foci of T2 hyperintensity in the cerebral white matter. These do not have restricted diffusion. There is no acute or chronic infarct. The ventricles are normal. No mass lesion. There are normal flow signals in the carotid arteries and basilar artery. No significant bone marrow signal abnormality. No significant abnormality in the paranasal sinuses or soft tissues. IMPRESSION: There are multiple T2 hyperintensities within the cerebral white matter. These abnormalities are nonspecific and of uncertain etiology or clinical significance. They are more common in older patients and patients with risk factors for vascular disease. No acute infarct. Electronically Signed   By: Nancyann Burns M.D.   On: 10/13/2024 15:26    Scheduled Meds:  amLODipine   5 mg Oral Daily   aspirin  EC  81 mg Oral QHS   cyanocobalamin   2,000 mcg Oral QHS   fluticasone  furoate-vilanterol  1 puff Inhalation Daily   loratadine   10 mg Oral Daily   Rivaroxaban   15 mg Oral Q  supper   rosuvastatin   10 mg Oral Daily   sertraline   25 mg Oral Daily   umeclidinium bromide   1 puff Inhalation Daily   Continuous Infusions:   LOS: 0 days   Fredia Skeeter, MD [facility]  10/15/2024, 10:59 AM   *Please note that this is a verbal dictation therefore any spelling or grammatical errors are due to the Dragon Medical One system interpretation.  Please page via Amion and do not message via secure chat for urgent patient care matters. Secure chat can be used for non urgent patient care matters.  How to contact the [facility] Attending or Consulting provider 7A - 7P or covering provider during after hours 7P -7A, for this patient?  Check the care team in [facility] and look for a) attending/consulting [facility] provider listed and  b) the [facility] team listed. Page or secure chat 7A-7P. Log into [email or web address] and use [facility] universal password to access. If you do not have the password, please contact the hospital operator. Locate the [facility] provider you are looking for under [facility] and page to a number that you can be directly reached. If you still have difficulty reaching the provider, please page the [facility] (Director on Call) for the Hospitalists listed on amion for assistance.

## 2024-10-15 NOTE — Progress Notes (Signed)
 OT Cancellation Note  Patient Details Name: Jermane Brayboy MRN: [id number] DOB: 02-13-1935   Cancelled Treatment:    Reason Eval/Treat Not Completed: Fatigue/lethargy limiting ability to participate (Pt asleep upon OT arrival. Pt easy to wake but requesting OT return for  acute OT eval at a later time. OT to follow up as appropriate/available.)  Margarie Rockey HERO., OTR/L, MA Acute Rehab [phone]   Margarie FORBES Horns 10/15/2024, 10:36 AM

## 2024-10-16 DIAGNOSIS — G459 Transient cerebral ischemic attack, unspecified: Secondary | ICD-10-CM | POA: Diagnosis not present

## 2024-10-16 LAB — PROTIME-INR
INR: 1.6 — ABNORMAL HIGH (ref 0.8–1.2)
Prothrombin Time: 19.8 s — ABNORMAL HIGH (ref 11.4–15.2)

## 2024-10-16 MED ORDER — SODIUM CHLORIDE 0.9 % IV SOLN: INTRAVENOUS | Status: AC

## 2024-10-16 NOTE — TOC Progression Note (Signed)
 Transition of Care [facility]) - Progression Note    Patient Details  Name: Caleb Santana MRN: [id number] Date of Birth: 16-Mar-1935  [facility] of [facility]) CM/SW Contact  Andrez JULIANNA George, RN Phone Number: 10/16/2024, 12:50 PM  Clinical Narrative:     Pt with recommendations for outpatient therapy. Pt already attends exercise classes at [facility]. He asked to have outpt arranged there as well. Information on the AVS. Pt will call to schedule the first appointment.  IP Care management following.  Expected Discharge Plan: OP Rehab Barriers to Discharge: No Barriers Identified               Expected Discharge Plan and Services                                               Social Drivers of Health (SDOH) Interventions SDOH Screenings   Food Insecurity: No Food Insecurity (10/13/2024)  Housing: Patient Declined (10/13/2024)  Transportation Needs: Patient Declined (10/13/2024)  Utilities: Patient Declined (10/13/2024)  Social Connections: Unknown (10/13/2024)  Tobacco Use: Low Risk (10/12/2024)  Recent Concern: Tobacco Use - Medium Risk (10/04/2024)   Received from [facility]    Readmission Risk Interventions     No data to display

## 2024-10-16 NOTE — Progress Notes (Signed)
 Occupational Therapy Treatment Patient Details Name: Caleb Santana MRN: [id number] DOB: 1935/04/04 Today's Date: 10/16/2024   History of present illness 89 y.o. male admitted 10/12/24 with episode of dizziness, confusion and speech difficulty while out shopping with wife. Head CT negative for acute injury. Workup for TIA. PMH includes HTN, CVA, CKD IIIa, COPD, PE, chronic dizziness.   OT comments  Pt progressing slowly toward established OT goals secondary to low BP in standing. Continued visual assessment completed with improved visual oculomotor skills today. Continues to become dizzy with head turns. Pt supervision A for LB ADL and standing EOB; ambulation deferred due to BP 80/51 (60) HR 95. Continue to recommend OP OT once medically ready.       If plan is discharge home, recommend the following:  A little help with walking and/or transfers;A little help with bathing/dressing/bathroom;Assistance with cooking/housework;Assist for transportation;Help with stairs or ramp for entrance   Equipment Recommendations  None recommended by OT    Recommendations for Other Services      Precautions / Restrictions Precautions Precautions: Fall;Other (comment) Recall of Precautions/Restrictions: Intact Precaution/Restrictions Comments: symptomatic orthostatic hypotension with standing Restrictions Weight Bearing Restrictions Per Provider Order: No       Mobility Bed Mobility Overal bed mobility: Needs Assistance Bed Mobility: Supine to Sit, Sit to Supine     Supine to sit: Supervision Sit to supine: Supervision        Transfers Overall transfer level: Needs assistance Equipment used: Rolling walker (2 wheels) Transfers: Sit to/from Stand Sit to Stand: Contact guard assist           General transfer comment: CGA due to orthostasis with +incr dizziness     Balance Overall balance assessment: Needs assistance Sitting-balance support: Single extremity supported, No upper  extremity supported, Feet supported Sitting balance-Leahy Scale: Good     Standing balance support: No upper extremity supported, Single extremity supported, Bilateral upper extremity supported, During functional activity Standing balance-Leahy Scale: Fair                             ADL either performed or assessed with clinical judgement   ADL Overall ADL's : Needs assistance/impaired                     Lower Body Dressing: Supervision/safety;Sitting/lateral leans;Contact guard assist;Sit to/from stand   Toilet Transfer: Contact guard assist;Ambulation;Regular Toilet;Rolling walker (2 wheels)                  Extremity/Trunk Assessment Upper Extremity Assessment Upper Extremity Assessment: Right hand dominant;Overall WFL for tasks assessed (mild BUE tremor noted)   Lower Extremity Assessment Lower Extremity Assessment: Defer to PT evaluation   Cervical / Trunk Assessment Cervical / Trunk Assessment: Normal    Vision Patient Visual Report: Other (comment) (Pt and family report long-standing vision issues, 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 Due to issues with long-standing eye issues and current contact lenses, pt has not been able to wear his contacts for about a week.) Vision Assessment?: Yes Tracking/Visual Pursuits: Able to track stimulus in all quads without difficulty Saccades: Additional eye shifts occurred during testing;Additional head turns occurred during testing;Undershoots Convergence: Within functional limits Additional Comments: dizziness continued with pt cervical rotation   Perception Perception Perception: Impaired Preception Impairment Details: Topographical orientation Perception-Other Comments: vs poor depth perception   Praxis     Communication Communication Communication: Impaired Factors Affecting Communication: Hearing impaired   Cognition Arousal:  Alert Behavior During Therapy: [facility] for tasks  assessed/performed Cognition: No apparent impairments                               Following commands: Intact        Cueing   Cueing Techniques: Verbal cues  Exercises      Shoulder Instructions       General Comments began to discuss concepts of visual anchoring however not implemented in functional context as mobility limited by low BPs of 80/51 (60). despite counter pressure exercises, BP only raising 7 points.    Pertinent Vitals/ Pain       Pain Assessment Pain Assessment: No/denies pain  Home Living Family/patient expects to be discharged to:: Private residence Living Arrangements: Spouse/significant other Available Help at Discharge: Family;Available 24 hours/day Type of Home: House Home Access: Level entry     Home Layout: One level     Bathroom Shower/Tub: Producer, Television/film/video: Standard     Home Equipment: Pharmacist, Hospital (2 wheels)   Additional Comments: wife reports plan to buy pt a Rollator per PCP recommendation      Prior Functioning/Environment              Frequency  Min 2X/week        Progress Toward Goals  OT Goals(current goals can now be found in the care plan section)     Acute Rehab OT Goals Patient Stated Goal: get better OT Goal Formulation: With patient/family Time For Goal Achievement: 10/29/24 Potential to Achieve Goals: Good  Plan      Co-evaluation                 AM-PAC OT 6 Clicks Daily Activity     Outcome Measure   Help from another person eating meals?: None Help from another person taking care of personal grooming?: A Little Help from another person toileting, which includes using toliet, bedpan, or urinal?: A Little Help from another person bathing (including washing, rinsing, drying)?: A Little Help from another person to put on and taking off regular upper body clothing?: A Little Help from another person to put on and taking off regular lower body  clothing?: A Little 6 Click Score: 19    End of Session Equipment Utilized During Treatment: Gait belt;Rolling walker (2 wheels)  OT Visit Diagnosis: Other abnormalities of gait and mobility (R26.89)   Activity Tolerance Treatment limited secondary to medical complications (Comment);Patient tolerated treatment well   Patient Left in bed;with bed alarm set;with family/visitor present   Nurse Communication Mobility status        Time: 8840-8774 OT Time Calculation (min): 26 min  Charges: OT General Charges $OT Visit: 1 Visit OT Treatments $Self Care/Home Management : 8-22 mins $Therapeutic Activity: 8-22 mins  Caleb Santana, OTD, OTR/L [facility] Acute Rehabilitation Office: [phone]   Caleb Santana 10/16/2024, 1:54 PM

## 2024-10-16 NOTE — Progress Notes (Signed)
 PROGRESS NOTE    [name]  FMW:[id number] DOB: [birth year] DOA: 10/12/2024 PCP: Delilah Murray HERO., MD   Brief Narrative:  Caleb Santana is a 89 y.o. male with medical history significant of  HTN, CVA, CKD stage IIIa presented for evaluation of acute onset dizziness/lightheadedness, confusion, and difficulty with speech while he was shopping, but is now back to baseline; thus, pt admitted for TIA evaluation.   The patient experienced confusion and difficulty communicating, which began around 4:15p. The patient reported being unable to concentrate and having trouble using coherent speech. The patient's family mentioned that he was shopping earlier in the day. The patient has never experienced similar symptoms before. However, over the past few years, the family noticed episodes characterized by a blank look and unsteadiness, requiring assistance to prevent falling. These episodes typically lasted about thirty minutes, after which the patient returned to normal Of note, the pt's neurologist had planned an EEG to eval epilepsy, but this has not been completed as of yet. During the most recent episode, the patient had difficulties using the car door due to impaired vision, but no falls were reported. The patient has a history of stroke around twenty years ago, which affected his eye but not in a similar manner to the current episode.   In the ED, pt bradycardic and hypertensive. EKG showing sinus rhythm and no obvious STEMI. Troponin 26-->27. No chest pain reported.  CT head showing no acute intracranial process.  Chest x-ray showing no active cardiopulmonary disease.  UA not suggestive of infection.  Ethanol level <15.  Not hypoglycemic.  500 mL IV fluid bolus given.  Admitted for stroke workup.  Assessment & Plan:   Principal Problem:   TIA (transient ischemic attack)   Acute encephalopathy and strokelike symptoms/likely TIA, now resolved I was able to gather more history from the wife, she tells me  that patient has been suffering from dizziness for 20 years, more so since about 1 year and even more since about a month when along with dizziness, he is fainting, needing the family members to hold onto him.  However on Thursday, patient had dizziness, fainting and he was confused with slurred speech as well as blurred vision and those symptoms were resolved by the time he arrived to the ED.  Other than dizziness, patient has no other complaint at the moment.  Stroke ruled out with MRI negative.  Cannot rule out TIA.  Echo with normal ejection fraction, grade 1 diastolic dysfunction, no PFO.  EEG negative for epileptiform activity.  Seen by PT, was found to have significant orthostasis.  Patient was symptomatic (down to 65/44 standing with near syncope).  We gave him fluid bolus and continuous IV hydration, despite of that, patient has remained orthostatic last evening and this morning and is symptomatic.  Not safe to discharge today.  Plan to continue to work with PT OT, continue IV hydration, continue to check orthostatics and reassess tomorrow morning.  Sinus bradycardia: Patient was noted to have mild sinus bradycardia as well.  I do not think this mild degree of bradycardia is the reason for his symptoms as his bradycardia has resolved but he is still symptomatic.  Monitor on telemetry.  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    COPD -PTA Breo Ellipta  daily, umeclidnium inh daily, and albuterol  inh prn  DVT prophylaxis: Xarelto    Code Status: Full Code  Family Communication: Wife present at bedside.  Plan of care discussed with patient in length and he/she verbalized understanding and agreed with it.  Status is: Inpatient Remains inpatient appropriate because: Patient orthostatic positive and unsteady and high risk of fall, unsafe to  discharge.     Estimated body mass index is 29.86 kg/m as calculated from the following:   Height as of 06/27/20: 5' 6 (1.676 m).   Weight as of 06/27/20: 83.9 kg.    Nutritional Assessment: There is no height or weight on file to calculate BMI.. Seen by dietician.  I agree with the assessment and plan as outlined below: Nutrition Status:        . Skin Assessment: I have examined the patient's skin and I agree with the wound assessment as performed by the wound care RN as outlined below:    Consultants:  None  Procedures:  None  Antimicrobials:  Anti-infectives (From admission, onward)    None         Subjective: Patient seen and examined, wife at the bedside.  Still complains of dizziness when standing up or sitting up.  No other complaint.  Objective: Vitals:   10/16/24 0555 10/16/24 0722 10/16/24 0901 10/16/24 0903  BP: (!) 154/70 (!) 176/94 (!) 79/48 126/71  Pulse: 87 85    Resp:  19    Temp:  97.9 F (36.6 C)    TempSrc:  Oral    SpO2:  97%      Intake/Output Summary (Last 24 hours) at 10/16/2024 1023 Last data filed at 10/16/2024 9388 Gross per 24 hour  Intake 1303.48 ml  Output 200 ml  Net 1103.48 ml   There were no vitals filed for this visit.  Examination:  General exam: Appears calm and comfortable  Respiratory system: Clear to auscultation. Respiratory effort normal. Cardiovascular system: S1 & S2 heard, RRR. No JVD, murmurs, rubs, gallops or clicks. No pedal edema. Gastrointestinal system: Abdomen is nondistended, soft and nontender. No organomegaly or masses felt. Normal bowel sounds heard. Central nervous system: Alert and oriented. No focal neurological deficits. Extremities: Symmetric 5 x 5 power. Skin: No rashes, lesions or ulcers.  Psychiatry: Judgement and insight appear normal. Mood & affect appropriate.    Data Reviewed: I have personally reviewed following labs and imaging studies  CBC: Recent Labs  Lab 10/12/24 1824  10/13/24 0915 10/14/24 1005  WBC 9.0 8.1 8.0  NEUTROABS 7.0  --  6.3  HGB 13.0 12.8* 13.1  HCT 38.4* 37.9* 38.4*  MCV 90.8 90.9 90.4  PLT 189 195 190   Basic Metabolic Panel: Recent Labs  Lab 10/12/24 1824 10/13/24 0915  NA 137  --   K 4.2  --   CL 100  --   CO2 25  --   GLUCOSE 98  --   BUN 20  --   CREATININE 1.21 1.12  CALCIUM  9.2  --    GFR: CrCl cannot be calculated (Unknown ideal weight.). Liver Function Tests: Recent Labs  Lab 10/12/24 1824  AST 14*  ALT 10  ALKPHOS 46  BILITOT 0.7  PROT 6.9  ALBUMIN 4.4   No results for input(s): LIPASE, AMYLASE in the last 168 hours. No results for input(s): AMMONIA in the last 168 hours. Coagulation Profile: Recent Labs  Lab 10/12/24 1823 10/13/24 0507 10/14/24 0847 10/15/24 0653 10/16/24 0436  INR 1.7* 1.3* 1.9* 1.6* 1.6*   Cardiac Enzymes: No results for input(s): CKTOTAL,  CKMB, CKMBINDEX, TROPONINI in the last 168 hours. BNP (last 3 results) No results for input(s): PROBNP in the last 8760 hours. HbA1C: No results for input(s): HGBA1C in the last 72 hours. CBG: Recent Labs  Lab 10/12/24 1753  GLUCAP 94   Lipid Profile: No results for input(s): CHOL, HDL, LDLCALC, TRIG, CHOLHDL, LDLDIRECT in the last 72 hours. Thyroid Function Tests: No results for input(s): TSH, T4TOTAL, FREET4, T3FREE, THYROIDAB in the last 72 hours. Anemia Panel: No results for input(s): VITAMINB12, FOLATE, FERRITIN, TIBC, IRON, RETICCTPCT in the last 72 hours. Sepsis Labs: No results for input(s): PROCALCITON, LATICACIDVEN in the last 168 hours.  Recent Results (from the past 240 hours)  Resp panel by RT-PCR (RSV, Flu A&B, Covid) Anterior Nasal Swab     Status: None   Collection Time: 10/12/24  5:23 PM   Specimen: Anterior Nasal Swab  Result Value Ref Range Status   SARS Coronavirus 2 by RT PCR NEGATIVE NEGATIVE Final    Comment: (NOTE) SARS-CoV-2 target nucleic acids  are NOT DETECTED.  The SARS-CoV-2 RNA is generally detectable in upper respiratory specimens during the acute phase of infection. The lowest concentration of SARS-CoV-2 viral copies this assay can detect is 138 copies/mL. A negative result does not preclude SARS-Cov-2 infection and should not be used as the sole basis for treatment or other patient management decisions. A negative result may occur with  improper specimen collection/handling, submission of specimen other than nasopharyngeal swab, presence of viral mutation(s) within the areas targeted by this assay, and inadequate number of viral copies(<138 copies/mL). A negative result must be combined with clinical observations, patient history, and epidemiological information. The expected result is Negative.  Fact Sheet for Patients:  bloggercourse.com  Fact Sheet for Healthcare Providers:  seriousbroker.it  This test is no t yet approved or cleared by the [place]  FDA and  has been authorized for detection and/or diagnosis of SARS-CoV-2 by FDA under an Emergency Use Authorization (EUA). This EUA will remain  in effect (meaning this test can be used) for the duration of the COVID-19 declaration under Section 564(b)(1) of the Act, 21 U.S.C.section 360bbb-3(b)(1), unless the authorization is terminated  or revoked sooner.       Influenza A by PCR NEGATIVE NEGATIVE Final   Influenza B by PCR NEGATIVE NEGATIVE Final    Comment: (NOTE) The Xpert Xpress SARS-CoV-2/FLU/RSV plus assay is intended as an aid in the diagnosis of influenza from Nasopharyngeal swab specimens and should not be used as a sole basis for treatment. Nasal washings and aspirates are unacceptable for Xpert Xpress SARS-CoV-2/FLU/RSV testing.  Fact Sheet for Patients: bloggercourse.com  Fact Sheet for Healthcare Providers: seriousbroker.it  This test is  not yet approved or cleared by the [place]  FDA and has been authorized for detection and/or diagnosis of SARS-CoV-2 by FDA under an Emergency Use Authorization (EUA). This EUA will remain in effect (meaning this test can be used) for the duration of the COVID-19 declaration under Section 564(b)(1) of the Act, 21 U.S.C. section 360bbb-3(b)(1), unless the authorization is terminated or revoked.     Resp Syncytial Virus by PCR NEGATIVE NEGATIVE Final    Comment: (NOTE) Fact Sheet for Patients: bloggercourse.com  Fact Sheet for Healthcare Providers: seriousbroker.it  This test is not yet approved or cleared by the [place]  FDA and has been authorized for detection and/or diagnosis of SARS-CoV-2 by FDA under an Emergency Use Authorization (EUA). This EUA will remain in effect (meaning this test can be used) for  the duration of the COVID-19 declaration under Section 564(b)(1) of the Act, 21 U.S.C. section 360bbb-3(b)(1), unless the authorization is terminated or revoked.  Performed at [facility], [address] [place], [place] [address]      Radiology Studies: ECHOCARDIOGRAM COMPLETE Result Date: 10/15/2024    ECHOCARDIOGRAM REPORT   Patient Name:   Caleb Santana Date of Exam: 10/15/2024 Medical Rec #:  [id number]   Height:       66.0 in Accession #:    [id number]  Weight:       185.0 lb Date of Birth:  1935-04-03   BSA:          1.935 m Patient Age:    89 years    BP:           135/86 mmHg Patient Gender: M           HR:           73 bpm. Exam Location:  Inpatient Procedure: 2D Echo, 3D Echo, Color Doppler, Cardiac Doppler and Strain Analysis            (Both Spectral and Color Flow Doppler were utilized during            procedure). Indications:    TIA  History:        Patient has no prior history of Echocardiogram examinations.                 Stroke; Risk Factors:Hypertension.  Sonographer:    Logan Shove RDCS  Referring Phys: [id number] [facility]  Sonographer Comments: Suboptimal parasternal window. IMPRESSIONS  1. Left ventricular ejection fraction, by estimation, is 60 to 65%. The left ventricle has normal function. The left ventricle has no regional wall motion abnormalities. Left ventricular diastolic parameters are consistent with Grade I diastolic dysfunction (impaired relaxation). Elevated left atrial pressure. The average left ventricular global longitudinal strain is -20.8 %. The global longitudinal strain is normal.  2. Right ventricular systolic function is normal. The right ventricular size is normal.  3. The mitral valve is normal in structure. No evidence of mitral valve regurgitation. No evidence of mitral stenosis.  4. The aortic valve is tricuspid. Aortic valve regurgitation is mild. Aortic valve sclerosis/calcification is present, without any evidence of aortic stenosis.  5. The inferior vena cava is normal in size with greater than 50% respiratory variability, suggesting right atrial pressure of 3 mmHg. Comparison(s): No prior Echocardiogram. FINDINGS  Left Ventricle: Left ventricular ejection fraction, by estimation, is 60 to 65%. The left ventricle has normal function. The left ventricle has no regional wall motion abnormalities. The average left ventricular global longitudinal strain is -20.8 %. Strain was performed and the global longitudinal strain is normal. The left ventricular internal cavity size was normal in size. There is no left ventricular hypertrophy. Left ventricular diastolic parameters are consistent with Grade I diastolic dysfunction (impaired relaxation). Elevated left atrial pressure. Right Ventricle: The right ventricular size is normal. Right ventricular systolic function is normal. Left Atrium: Left atrial size was normal in size. Right Atrium: Right atrial size was normal in size. Pericardium: There is no evidence of pericardial effusion. Mitral Valve: The mitral valve is normal  in structure. Mild mitral annular calcification. No evidence of mitral valve regurgitation. No evidence of mitral valve stenosis. Tricuspid Valve: The tricuspid valve is normal in structure. Tricuspid valve regurgitation is trivial. No evidence of tricuspid stenosis. Aortic Valve: The aortic valve is tricuspid. Aortic valve regurgitation is mild.  Aortic valve sclerosis/calcification is present, without any evidence of aortic stenosis. Aortic valve peak gradient measures 2.3 mmHg. Pulmonic Valve: The pulmonic valve was normal in structure. Pulmonic valve regurgitation is trivial. No evidence of pulmonic stenosis. Aorta: The aortic root is normal in size and structure. Venous: The inferior vena cava is normal in size with greater than 50% respiratory variability, suggesting right atrial pressure of 3 mmHg. IAS/Shunts: No atrial level shunt detected by color flow Doppler. Additional Comments: 3D was performed not requiring image post processing on an independent workstation and was normal.  LEFT VENTRICLE PLAX 2D LVIDd:         4.00 cm   Diastology LVIDs:         2.60 cm   LV e' medial:    3.37 cm/s LV PW:         0.70 cm   LV E/e' medial:  16.9 LV IVS:        1.00 cm   LV e' lateral:   4.57 cm/s LVOT diam:     2.20 cm   LV E/e' lateral: 12.5 LVOT Area:     3.80 cm LV IVRT:       155 msec  2D Longitudinal Strain                          2D Strain GLS Avg:     -20.8 %                           3D Volume EF:                          3D EF:        62 %                          LV EDV:       178 ml                          LV ESV:       67 ml                          LV SV:        111 ml RIGHT VENTRICLE             IVC RV Basal diam:  3.60 cm     IVC diam: 1.80 cm RV S prime:     13.30 cm/s TAPSE (M-mode): 1.8 cm LEFT ATRIUM             Index        RIGHT ATRIUM           Index LA diam:        3.80 cm 1.96 cm/m   RA Area:     17.20 cm LA Vol (A2C):   48.3 ml 24.97 ml/m  RA Volume:   49.90 ml  25.79 ml/m LA Vol (A4C):    53.3 ml 27.55 ml/m LA Biplane Vol: 52.3 ml 27.03 ml/m  AORTIC VALVE AV Area (Vmax): 4.13 cm AV Vmax:        75.60 cm/s AV Peak Grad:   2.3 mmHg LVOT Vmax:      82.10 cm/s  AORTA Ao Root diam: 2.40 cm MITRAL VALVE MV Area (PHT):  2.91 cm    SHUNTS MV Decel Time: 261 msec    Systemic Diam: 2.20 cm MV E velocity: 57.00 cm/s MV A velocity: 97.90 cm/s MV E/A ratio:  0.58 Redell Shallow MD Electronically signed by Redell Shallow MD Signature Date/Time: 10/15/2024/11:17:00 AM    Final    EEG adult Result Date: 10/14/2024 Shelton Arlin KIDD, MD     10/14/2024  3:32 PM Patient Name: Kashten Gowin MRN: [id number] Epilepsy Attending: Arlin KIDD Shelton Referring Physician/Provider: Georgina Basket, MD Date: 10/14/2024 Duration: 22.21 mins Patient history: 89yo  with acute onset dizziness/ lightheadedness, confusion, and difficulty with speech. EEG to evaluate for seizure Level of alertness: Awake AEDs during EEG study: None Technical aspects: This EEG study was done with scalp electrodes positioned according to the 10-20 International system of electrode placement. Electrical activity was reviewed with band pass filter of 1-70Hz , sensitivity of 7 uV/mm, display speed of 9mm/sec with a 60Hz  notched filter applied as appropriate. EEG data were recorded continuously and digitally stored.  Video monitoring was available and reviewed as appropriate. Description: The posterior dominant rhythm consists of 7 Hz activity of moderate voltage (25-35 uV) seen predominantly in posterior head regions, symmetric and reactive to eye opening and eye closing. Hyperventilation and photic stimulation were not performed.   ABNORMALITY - Background slow IMPRESSION: This study is suggestive of generalized cerebral dysfunction (encephalopathy). No seizures or epileptiform discharges were seen throughout the recording. Priyanka O Yadav    Scheduled Meds:  aspirin  EC  81 mg Oral QHS   cyanocobalamin   2,000 mcg Oral QHS   fluticasone   furoate-vilanterol  1 puff Inhalation Daily   loratadine   10 mg Oral Daily   Rivaroxaban   15 mg Oral Q supper   rosuvastatin   10 mg Oral Daily   sertraline   25 mg Oral Daily   umeclidinium bromide   1 puff Inhalation Daily   Continuous Infusions:   LOS: 1 day   Fredia Skeeter, MD [facility]  10/16/2024, 10:23 AM   *Please note that this is a verbal dictation therefore any spelling or grammatical errors are due to the Dragon Medical One system interpretation.  Please page via Amion and do not message via secure chat for urgent patient care matters. Secure chat can be used for non urgent patient care matters.  How to contact the [facility] Attending or Consulting provider 7A - 7P or covering provider during after hours 7P -7A, for this patient?  Check the care team in [facility] and look for a) attending/consulting [facility] provider listed and b) the [facility] team listed. Page or secure chat 7A-7P. Log into [email or web address] and use [facility] universal password to access. If you do not have the password, please contact the hospital operator. Locate the [facility] provider you are looking for under [facility] and page to a number that you can be directly reached. If you still have difficulty reaching the provider, please page the [facility] (Director on Call) for the Hospitalists listed on amion for assistance.

## 2024-10-16 NOTE — TOC CAGE-AID Note (Signed)
 Transition of Care [facility]) - CAGE-AID Screening   Patient Details  Name: Caleb Santana MRN: [id number] Date of Birth: 1935/10/09  Transition of Care [facility]) CM/SW Contact:    Josecarlos Harriott E Antia Rahal, LCSW Phone Number: 10/16/2024, 10:19 AM   Clinical Narrative: No SA noted.   CAGE-AID Screening:    Have You Ever Felt You Ought to Cut Down on Your Drinking or Drug Use?: No Have People Annoyed You By Critizing Your Drinking Or Drug Use?: No Have You Felt Bad Or Guilty About Your Drinking Or Drug Use?: No Have You Ever Had a Drink or Used Drugs First Thing In The Morning to Steady Your Nerves or to Get Rid of a Hangover?: No CAGE-AID Score: 0  Substance Abuse Education Offered: No

## 2024-10-16 NOTE — Progress Notes (Signed)
 Physical Therapy Treatment Patient Details Name: Caleb Santana MRN: [id number] DOB: Aug 21, 1935 Today's Date: 10/16/2024   History of Present Illness 89 y.o. male admitted 10/12/24 with episode of dizziness, confusion and speech difficulty while out shopping with wife. Head CT negative for acute injury. Workup for TIA. PMH includes HTN, CVA, CKD IIIa, COPD, PE, chronic dizziness.    PT Comments  Patient with persistent orthostasis with moderate symptoms (worsening the longer he was up and began describing pre-syncope sensation). Patient was wearing TED hose in supine on arrival. Pt/wife report long history of dizziness and report it has recently changed to a feeling of faintness and near-fainting/near-falling. He has a difficult time describing the dizziness, however he has a component that happens and then stops quickly when he changes position--best he can describe it feels like an imbalance. ?vestibular in origin. MD and RN made aware of symptomatic orthostasis.      10/16/24 0900  Vital Signs  BP Location Left Arm  BP Method Automatic  Patient Position (if appropriate) Orthostatic Vitals  Orthostatic Lying   BP- Lying (!) 159/92  Pulse- Lying 82  Orthostatic Sitting  BP- Sitting 127/90  Pulse- Sitting 85  Orthostatic Standing at 0 minutes  BP- Standing at 0 minutes 97/60  Pulse- Standing at 0 minutes 93  Orthostatic Standing at 3 minutes  BP- Standing at 3 minutes (!) 86/55  Pulse- Standing at 3 minutes 98  After walking 25 ft                                  74/48                                                              101   If plan is discharge home, recommend the following: Assistance with cooking/housework;Assist for transportation;Help with stairs or ramp for entrance;A little help with walking and/or transfers;A little help with bathing/dressing/bathroom   Can travel by private vehicle        Equipment Recommendations  None recommended by PT    Recommendations for  Other Services       Precautions / Restrictions Precautions Precautions: Fall;Other (comment) Recall of Precautions/Restrictions: Intact Precaution/Restrictions Comments: symptomatic orthostatic hypotension with standing Restrictions Weight Bearing Restrictions Per Provider Order: No     Mobility  Bed Mobility Overal bed mobility: Needs Assistance Bed Mobility: Supine to Sit, Sit to Supine     Supine to sit: Supervision Sit to supine: Supervision   General bed mobility comments: Recommend Supervision for safety due to pt reports of dizziness    Transfers Overall transfer level: Needs assistance Equipment used: Rolling walker (2 wheels) Transfers: Sit to/from Stand Sit to Stand: Contact guard assist           General transfer comment: CGA due to orthostasis with +incr dizziness    Ambulation/Gait Ambulation/Gait assistance: Contact guard assist Gait Distance (Feet): 25 Feet (to/from bathroom and standing to urinate) Assistive device: Rolling walker (2 wheels) Gait Pattern/deviations: Step-through pattern, Decreased stride length       General Gait Details: BP continued to drop with ambulation with incr dizziness   Stairs             Wheelchair Mobility  Tilt Bed    Modified Rankin (Stroke Patients Only)       Balance Overall balance assessment: Needs assistance Sitting-balance support: Single extremity supported, No upper extremity supported, Feet supported Sitting balance-Leahy Scale: Good     Standing balance support: No upper extremity supported, Single extremity supported, Bilateral upper extremity supported, During functional activity Standing balance-Leahy Scale: Fair                              Hotel Manager: Impaired Factors Affecting Communication: Hearing impaired  Cognition Arousal: Alert Behavior During Therapy: WFL for tasks assessed/performed   PT - Cognitive impairments: No  apparent impairments                         Following commands: Intact      Cueing Cueing Techniques: Verbal cues  Exercises      General Comments        Pertinent Vitals/Pain Pain Assessment Pain Assessment: No/denies pain    Home Living                          Prior Function            PT Goals (current goals can now be found in the care plan section) Acute Rehab PT Goals Patient Stated Goal: improved dizziness Time For Goal Achievement: 10/29/24 Potential to Achieve Goals: Good Progress towards PT goals: Not progressing toward goals - comment (persistent orthostasis)    Frequency    Min 1X/week      PT Plan      Co-evaluation              AM-PAC PT 6 Clicks Mobility   Outcome Measure  Help needed turning from your back to your side while in a flat bed without using bedrails?: None Help needed moving from lying on your back to sitting on the side of a flat bed without using bedrails?: A Little Help needed moving to and from a bed to a chair (including a wheelchair)?: A Little Help needed standing up from a chair using your arms (e.g., wheelchair or bedside chair)?: A Little Help needed to walk in hospital room?: A Little Help needed climbing 3-5 steps with a railing? : Total 6 Click Score: 17    End of Session Equipment Utilized During Treatment: Gait belt Activity Tolerance: Treatment limited secondary to medical complications (Comment) (orthostasis) Patient left: in bed;with call bell/phone within reach;with bed alarm set;with family/visitor present Nurse Communication: Mobility status;Other (comment) (orthostatics) PT Visit Diagnosis: Other abnormalities of gait and mobility (R26.89);Dizziness and giddiness (R42)     Time: 0830-0900 PT Time Calculation (min) (ACUTE ONLY): 30 min  Charges:    $Gait Training: 8-22 mins $Therapeutic Activity: 8-22 mins PT General Charges $$ ACUTE PT VISIT: 1 Visit                       Macario RAMAN, PT Acute Rehabilitation Services  Office [phone]    Macario SHAUNNA Soja 10/16/2024, 9:13 AM

## 2024-10-17 DIAGNOSIS — G459 Transient cerebral ischemic attack, unspecified: Secondary | ICD-10-CM | POA: Diagnosis not present

## 2024-10-17 LAB — BASIC METABOLIC PANEL WITH GFR
Anion gap: 10 (ref 5–15)
BUN: 10 mg/dL (ref 8–23)
CO2: 23 mmol/L (ref 22–32)
Calcium: 8.2 mg/dL — ABNORMAL LOW (ref 8.9–10.3)
Chloride: 105 mmol/L (ref 98–111)
Creatinine, Ser: 1.01 mg/dL (ref 0.61–1.24)
GFR, Estimated: >60 mL/min (ref >60)
Glucose, Bld: 116 mg/dL — ABNORMAL HIGH (ref 70–99)
Potassium: 3.5 mmol/L (ref 3.5–5.1)
Sodium: 138 mmol/L (ref 135–145)

## 2024-10-17 LAB — PROTIME-INR
INR: 1.8 — ABNORMAL HIGH (ref 0.8–1.2)
Prothrombin Time: 21.8 s — ABNORMAL HIGH (ref 11.4–15.2)

## 2024-10-17 NOTE — TOC Transition Note (Signed)
 Transition of Care [facility]) - Discharge Note   Patient Details  Name: Caleb Santana MRN: [id number] Date of Birth: 07/13/1935  [facility] of [facility] CM/SW Contact:  Andrez JULIANNA George, RN Phone Number: 10/17/2024, 9:55 AM   Clinical Narrative:     Pt is discharging home with outpatient therapy through [facility] Outpatient therapy. Referral sent and information on the AVS.  Walker ordered through [facility] for home.  Pt has transportation home.  Final next level of care: OP Rehab Barriers to Discharge: No Barriers Identified   Patient Goals and CMS Choice     Choice offered to / list presented to : Patient      Discharge Placement                       Discharge Plan and Services Additional resources added to the After Visit Summary for                  DME Arranged: Walker rolling DME Agency: [employer] Date DME Agency Contacted: 10/17/24   Representative spoke with at DME Agency: [place]            Social Drivers of Health (SDOH) Interventions SDOH Screenings   Food Insecurity: No Food Insecurity (10/13/2024)  Housing: Patient Declined (10/13/2024)  Transportation Needs: Patient Declined (10/13/2024)  Utilities: Patient Declined (10/13/2024)  Social Connections: Unknown (10/13/2024)  Tobacco Use: Low Risk (10/12/2024)  Recent Concern: Tobacco Use - Medium Risk (10/04/2024)   Received from [facility]     Readmission Risk Interventions     No data to display

## 2024-10-17 NOTE — Progress Notes (Signed)
 Physical Therapy Treatment Patient Details Name: Caleb Santana MRN: [id number] DOB: 1935/07/21 Today's Date: 10/17/2024   History of Present Illness 89 y.o. male admitted 10/12/24 with episode of dizziness, confusion and speech difficulty while out shopping with wife. Head CT negative for acute injury. Workup for TIA. PMH includes HTN, CVA, CKD IIIa, COPD, PE, chronic dizziness.    PT Comments  Pt continues to have symptomatic orthostatic hypotension at this time, vitals documented in vitals flowsheet, despite use of TED hose and recently completing a fluid bolus per RN report. Pt appears more confused compared to prior PT notes, disoriented to place and with increased difficulty following multi-step commands. Pt also has poor memory during session as he is unable to recall place after ~5-10 minutes. Despite orthostatic hypotension and cognitive impairments the pt remains able to transfer and ambulate without physical assistance when utilizing a RW. PT provides education regarding methods to reduce the risk of potential syncope, including increased time between positional changes as well as LE exercise prior to standing. PT recommends discharge home with outpatient PT and a RW in the hopes that pt cognition will improve when out of the hospital and in a more familiar environment.    If plan is discharge home, recommend the following: Assistance with cooking/housework;Assist for transportation;Help with stairs or ramp for entrance;A little help with walking and/or transfers;A little help with bathing/dressing/bathroom   Can travel by private vehicle        Equipment Recommendations  Rolling walker (2 wheels)    Recommendations for Other Services       Precautions / Restrictions Precautions Precautions: Fall;Other (comment) Recall of Precautions/Restrictions: Impaired Precaution/Restrictions Comments: symptomatic orthostatic hypotension with standing Restrictions Weight Bearing Restrictions  Per Provider Order: No     Mobility  Bed Mobility Overal bed mobility: Needs Assistance Bed Mobility: Supine to Sit, Sit to Supine     Supine to sit: Supervision Sit to supine: Supervision        Transfers Overall transfer level: Needs assistance Equipment used: Rolling walker (2 wheels) Transfers: Sit to/from Stand Sit to Stand: Contact guard assist                Ambulation/Gait Ambulation/Gait assistance: Contact guard assist Gait Distance (Feet): 120 Feet Assistive device: Rolling walker (2 wheels) Gait Pattern/deviations: Step-through pattern Gait velocity: reduced Gait velocity interpretation: <1.8 ft/sec, indicate of risk for recurrent falls   General Gait Details: slowed step-through gait, cueing to maintain base of support within RW when turning to enter hospital room. Pt reports feeling fuzzy and weak when ambulating but declines need to stop gait to sit. Pt also with a difficult time following PT cues to attempt ambulation without RW, not following to release grip of walker after reporting he felt like he didnt need it. When asked again seconds later the pt indicates he needs the walker for support   Stairs             Wheelchair Mobility     Tilt Bed    Modified Rankin (Stroke Patients Only)       Balance Overall balance assessment: Needs assistance Sitting-balance support: No upper extremity supported, Feet supported Sitting balance-Leahy Scale: Good     Standing balance support: Single extremity supported, Reliant on assistive device for balance Standing balance-Leahy Scale: Poor                              Communication Communication Communication: Impaired  Factors Affecting Communication: Hearing impaired  Cognition Arousal: Alert Behavior During Therapy: WFL for tasks assessed/performed   PT - Cognitive impairments: Orientation, Memory, Awareness, Attention, Sequencing, Problem solving   Orientation impairments:  Place, Time                     Following commands: Intact      Cueing Cueing Techniques: Verbal cues  Exercises      General Comments General comments (skin integrity, edema, etc.): orthostatic vitals documented in vitals flowsheet      Pertinent Vitals/Pain Pain Assessment Pain Assessment: No/denies pain    Home Living                          Prior Function            PT Goals (current goals can now be found in the care plan section) Acute Rehab PT Goals Patient Stated Goal: improved dizziness Progress towards PT goals: Progressing toward goals    Frequency    Min 1X/week      PT Plan      Co-evaluation              AM-PAC PT 6 Clicks Mobility   Outcome Measure  Help needed turning from your back to your side while in a flat bed without using bedrails?: A Little Help needed moving from lying on your back to sitting on the side of a flat bed without using bedrails?: A Little Help needed moving to and from a bed to a chair (including a wheelchair)?: A Little Help needed standing up from a chair using your arms (e.g., wheelchair or bedside chair)?: A Little Help needed to walk in hospital room?: A Little Help needed climbing 3-5 steps with a railing? : A Lot 6 Click Score: 17    End of Session Equipment Utilized During Treatment: Gait belt Activity Tolerance: Patient tolerated treatment well Patient left: in bed;with call bell/phone within reach;with bed alarm set;with family/visitor present Nurse Communication: Mobility status PT Visit Diagnosis: Other abnormalities of gait and mobility (R26.89);Dizziness and giddiness (R42)     Time: 9240-9147 PT Time Calculation (min) (ACUTE ONLY): 53 min  Charges:    $Gait Training: 8-22 mins $Therapeutic Activity: 23-37 mins PT General Charges $$ ACUTE PT VISIT: 1 Visit                     Bernardino JINNY Ruth, PT, DPT Acute Rehabilitation Office [phone]    Bernardino JINNY Ruth 10/17/2024, 12:09 PM

## 2024-10-17 NOTE — Discharge Summary (Signed)
 Physician Discharge Summary  Caleb Santana DOB: 06-27-1935 DOA: 10/12/2024  PCP: Delilah Murray HERO., MD  Admit date: 10/12/2024 Discharge date: 10/17/2024 30 Day Unplanned Readmission Risk Score    Flowsheet Row ED to Hosp-Admission (Current) from 10/12/2024 in [facility]  30 Day Unplanned Readmission Risk Score (%) 10.36 Filed at 10/17/2024 0802    This score is the patient's risk of an unplanned readmission within 30 days of being discharged (0 -100%). The score is based on dignosis, age, lab data, medications, orders, and past utilization.   Low:  0-14.9   Medium: 15-21.9   High: 22-29.9   Extreme: 30 and above          Admitted From: Home Disposition: Home  Recommendations for Outpatient Follow-up:  Follow up with PCP in 1-2 weeks Please obtain BMP/CBC in one week Follow-up with neurology for thorough workup of cognitive deficit and Parkinson's disease Please follow up with your PCP on the following pending results: Unresulted Labs (From admission, onward)     Start     Ordered   10/18/24 0500  CBC with Differential/Platelet  Tomorrow morning,   R       Question:  Specimen collection method  Answer:  Lab=Lab collect   10/17/24 0757   10/13/24 0500  Protime-INR  Daily,   R      10/13/24 0043              Home Health: None-outpatient PT OT referral has been made Equipment/Devices: Rolling walker  Discharge Condition: Stable CODE STATUS: Full code Diet recommendation:  Diet Order             Diet regular Room service appropriate? Yes; Fluid consistency: Thin  Diet effective now                   Subjective: Patient seen and examined, wife at the bedside as well as nurse at the bedside.  Patient has no complaints.  Denies any dizziness while not moving a lot.  No other complaint.  Lengthy discussion with the wife and husband, details below.  They are in agreement with going home.  Brief/Interim Summary: Caleb Santana is a 89  y.o. male with medical history significant of  HTN, CVA, CKD stage IIIa presented for evaluation of acute onset dizziness/lightheadedness, confusion, and difficulty with speech while he was shopping, but is now back to baseline; thus, pt admitted for TIA evaluation.   The patient experienced confusion and difficulty communicating, which began around 4:15p. The patient reported being unable to concentrate and having trouble using coherent speech. The patient's family mentioned that he was shopping earlier in the day. The patient has never experienced similar symptoms before. However, over the past few years, the family noticed episodes characterized by a blank look and unsteadiness, requiring assistance to prevent falling. These episodes typically lasted about thirty minutes, after which the patient returned to normal Of note, the pt's neurologist had planned an EEG to eval epilepsy, but this has not been completed as of yet. During the most recent episode, the patient had difficulties using the car door due to impaired vision, but no falls were reported. The patient has a history of stroke around twenty years ago, which affected his eye but not in a similar manner to the current episode.   In the ED, pt bradycardic and hypertensive. EKG showing sinus rhythm and no obvious STEMI. Troponin 26-->27. No chest pain reported.  CT head showing no acute intracranial  process.  Chest x-ray showing no active cardiopulmonary disease.  UA not suggestive of infection.  Ethanol level <15.  Not hypoglycemic.  500 mL IV fluid bolus given.  Admitted for stroke workup.  Details below.   Acute encephalopathy and strokelike symptoms/likely TIA/orthostatic hypotension: I was able to gather more history from the wife, she tells me that patient has been suffering from dizziness for 20 years, more so since about 1 year and even more since about a month when along with dizziness, he is fainting, needing the family members to hold onto  him.  However on Thursday, patient had dizziness, fainting and he was confused with slurred speech as well as blurred vision and those symptoms were resolved by the time he arrived to the ED.  Other than dizziness, patient has no other complaint at the moment.  Stroke ruled out with MRI negative.  Cannot rule out TIA.  Echo with normal ejection fraction, grade 1 diastolic dysfunction, no PFO.  EEG negative for epileptiform activity.  Seen by PT, was found to have significant orthostasis.  Patient was symptomatic (down to 65/44 standing with near syncope).  We gave him fluid bolus and continuous IV hydration for last 2 days and despite of that, patient remains orthostatic positive although his symptoms have slightly improved.  I was able to gather more history from the wife, patient does have memory deficit which has been progressing lately, she has also noted some tremors in the hands which makes me wonder about possibility of early Parkinson's dementia and orthostatic hypotension secondary to sympathetic denervation which is common in Parkinson's disease.  Unfortunately, this is hard to treat.  We have decided to avoid all sorts of rate controlled medications or antihypertensive medications.  Patient has been significantly advised multiple times by myself about taking measures to prevent falls.  He and his wife both understand.  Per PT OT recommendation, outpatient PT OT referral has been made and he is being sent home with rolling walker.  I have recommended follow-up with neurology for formal testing for Parkinson's disease.   Sinus bradycardia: Patient was noted to have mild sinus bradycardia as well.  I do not think this mild degree of bradycardia is the reason for his symptoms as his bradycardia has resolved but he is still symptomatic.  Monitor on telemetry.  We discontinued diltiazem.  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    COPD -PTA Breo Ellipta  daily, umeclidnium inh daily, and albuterol  inh prn  Discharge plan was discussed with patient and/or family member and they verbalized understanding and agreed with it.  Discharge Diagnoses:  Principal Problem:   TIA (transient ischemic attack)    Discharge Instructions  Discharge Instructions     Ambulatory referral to Occupational Therapy   Complete by: As directed    Ambulatory referral to Physical Therapy   Complete by: As directed       Allergies as of 10/17/2024   No Known Allergies      Medication List     STOP taking these medications    diltiazem 120 MG 24 hr capsule Commonly known as: CARDIZEM CD       TAKE these medications    albuterol  108 (90 Base) MCG/ACT inhaler Commonly known as: VENTOLIN  HFA Inhale 1-2 puffs into the lungs every 6 (six) hours as needed for  wheezing or shortness of breath.   aspirin  EC 81 MG tablet Take 81 mg by mouth at bedtime. Swallow whole.   Breo Ellipta  100-25 MCG/INH Aepb Generic drug: fluticasone  furoate-vilanterol Inhale 1 puff into the lungs daily.   clonazePAM  1 MG tablet Commonly known as: KLONOPIN  Take 1 mg by mouth at bedtime as needed (sleep).   cyanocobalamin  1000 MCG tablet Commonly known as: VITAMIN B12 Take 2,000 mcg by mouth at bedtime.   loratadine  10 MG tablet Commonly known as: CLARITIN  Take by mouth.   nitroGLYCERIN 0.4 MG SL tablet Commonly known as: NITROSTAT Place 0.4 mg under the tongue every 5 (five) minutes as needed for chest pain.   Rivaroxaban  15 MG Tabs tablet Commonly known as: XARELTO  Take 15 mg by mouth daily with supper.   rosuvastatin  10 MG tablet Commonly known as: CRESTOR  Take 10 mg by mouth daily.   sertraline  25 MG tablet Commonly known as: ZOLOFT  Take 25 mg by mouth daily.   Spiriva HandiHaler 18 MCG Caps Generic drug: Tiotropium Bromide INSERT 1 CAPSULE IN HANDIHALER  AND INHALE THE CONTENTS BY MOUTH ONCE DAILY        Follow-up Information     [facility] Follow up.   Why: [facility] will contact you for the first appointment Contact information: [address], [place], [place] [address]  Phone: [phone]        Delilah Murray HERO., MD Follow up in 1 week(s).   Specialty: Internal Medicine Contact information: [address] [place] [address] [phone]                Allergies[1]  Consultations: None   Procedures/Studies: ECHOCARDIOGRAM COMPLETE Result Date: 10/15/2024    ECHOCARDIOGRAM REPORT   Patient Name:   Caleb Santana Date of Exam: 10/15/2024 Medical Rec #:  [id number]   Height:       66.0 in Accession #:    [id number]  Weight:       185.0 lb Date of Birth:  [birth year]   BSA:          1.935 m Patient Age:    89 years    BP:           135/86 mmHg Patient Gender: M           HR:           73 bpm. Exam Location:  Inpatient Procedure: 2D Echo, 3D Echo, Color Doppler, Cardiac Doppler and Strain Analysis            (Both Spectral and Color Flow Doppler were utilized during            procedure). Indications:    TIA  History:        Patient has no prior history of Echocardiogram examinations.                 Stroke; Risk Factors:Hypertension.  Sonographer:    Logan Shove RDCS Referring Phys: [id number] [facility]  Sonographer Comments: Suboptimal parasternal window. IMPRESSIONS  1. Left ventricular ejection fraction, by estimation, is 60 to 65%. The left ventricle has normal function. The left ventricle has no regional wall motion abnormalities. Left ventricular diastolic parameters are consistent with Grade I diastolic dysfunction (impaired relaxation). Elevated left atrial pressure. The average left ventricular global longitudinal strain is -20.8 %. The global longitudinal strain is normal.  2. Right ventricular systolic function is normal. The  right ventricular size  is normal.  3. The mitral valve is normal in structure. No evidence of mitral valve regurgitation. No evidence of mitral stenosis.  4. The aortic valve is tricuspid. Aortic valve regurgitation is mild. Aortic valve sclerosis/calcification is present, without any evidence of aortic stenosis.  5. The inferior vena cava is normal in size with greater than 50% respiratory variability, suggesting right atrial pressure of 3 mmHg. Comparison(s): No prior Echocardiogram. FINDINGS  Left Ventricle: Left ventricular ejection fraction, by estimation, is 60 to 65%. The left ventricle has normal function. The left ventricle has no regional wall motion abnormalities. The average left ventricular global longitudinal strain is -20.8 %. Strain was performed and the global longitudinal strain is normal. The left ventricular internal cavity size was normal in size. There is no left ventricular hypertrophy. Left ventricular diastolic parameters are consistent with Grade I diastolic dysfunction (impaired relaxation). Elevated left atrial pressure. Right Ventricle: The right ventricular size is normal. Right ventricular systolic function is normal. Left Atrium: Left atrial size was normal in size. Right Atrium: Right atrial size was normal in size. Pericardium: There is no evidence of pericardial effusion. Mitral Valve: The mitral valve is normal in structure. Mild mitral annular calcification. No evidence of mitral valve regurgitation. No evidence of mitral valve stenosis. Tricuspid Valve: The tricuspid valve is normal in structure. Tricuspid valve regurgitation is trivial. No evidence of tricuspid stenosis. Aortic Valve: The aortic valve is tricuspid. Aortic valve regurgitation is mild. Aortic valve sclerosis/calcification is present, without any evidence of aortic stenosis. Aortic valve peak gradient measures 2.3 mmHg. Pulmonic Valve: The pulmonic valve was normal in structure. Pulmonic valve regurgitation is trivial. No evidence of  pulmonic stenosis. Aorta: The aortic root is normal in size and structure. Venous: The inferior vena cava is normal in size with greater than 50% respiratory variability, suggesting right atrial pressure of 3 mmHg. IAS/Shunts: No atrial level shunt detected by color flow Doppler. Additional Comments: 3D was performed not requiring image post processing on an independent workstation and was normal.  LEFT VENTRICLE PLAX 2D LVIDd:         4.00 cm   Diastology LVIDs:         2.60 cm   LV e' medial:    3.37 cm/s LV PW:         0.70 cm   LV E/e' medial:  16.9 LV IVS:        1.00 cm   LV e' lateral:   4.57 cm/s LVOT diam:     2.20 cm   LV E/e' lateral: 12.5 LVOT Area:     3.80 cm LV IVRT:       155 msec  2D Longitudinal Strain                          2D Strain GLS Avg:     -20.8 %                           3D Volume EF:                          3D EF:        62 %                          LV EDV:       178  ml                          LV ESV:       67 ml                          LV SV:        111 ml RIGHT VENTRICLE             IVC RV Basal diam:  3.60 cm     IVC diam: 1.80 cm RV S prime:     13.30 cm/s TAPSE (M-mode): 1.8 cm LEFT ATRIUM             Index        RIGHT ATRIUM           Index LA diam:        3.80 cm 1.96 cm/m   RA Area:     17.20 cm LA Vol (A2C):   48.3 ml 24.97 ml/m  RA Volume:   49.90 ml  25.79 ml/m LA Vol (A4C):   53.3 ml 27.55 ml/m LA Biplane Vol: 52.3 ml 27.03 ml/m  AORTIC VALVE AV Area (Vmax): 4.13 cm AV Vmax:        75.60 cm/s AV Peak Grad:   2.3 mmHg LVOT Vmax:      82.10 cm/s  AORTA Ao Root diam: 2.40 cm MITRAL VALVE MV Area (PHT): 2.91 cm    SHUNTS MV Decel Time: 261 msec    Systemic Diam: 2.20 cm MV E velocity: 57.00 cm/s MV A velocity: 97.90 cm/s MV E/A ratio:  0.58 Redell Shallow MD Electronically signed by Redell Shallow MD Signature Date/Time: 10/15/2024/11:17:00 AM    Final    EEG adult Result Date: 10/14/2024 Shelton Arlin KIDD, MD     10/14/2024  3:32 PM Patient Name: Caleb Santana  MRN: [id number] Epilepsy Attending: Arlin KIDD Shelton Referring Physician/Provider: Georgina Basket, MD Date: 10/14/2024 Duration: 22.21 mins Patient history: 89yo  with acute onset dizziness/ lightheadedness, confusion, and difficulty with speech. EEG to evaluate for seizure Level of alertness: Awake AEDs during EEG study: None Technical aspects: This EEG study was done with scalp electrodes positioned according to the 10-20 International system of electrode placement. Electrical activity was reviewed with band pass filter of 1-70Hz , sensitivity of 7 uV/mm, display speed of 63mm/sec with a 60Hz  notched filter applied as appropriate. EEG data were recorded continuously and digitally stored.  Video monitoring was available and reviewed as appropriate. Description: The posterior dominant rhythm consists of 7 Hz activity of moderate voltage (25-35 uV) seen predominantly in posterior head regions, symmetric and reactive to eye opening and eye closing. Hyperventilation and photic stimulation were not performed.   ABNORMALITY - Background slow IMPRESSION: This study is suggestive of generalized cerebral dysfunction (encephalopathy). No seizures or epileptiform discharges were seen throughout the recording. Arlin KIDD Shelton   MR BRAIN WO CONTRAST Result Date: 10/13/2024 CLINICAL DATA:  Stroke suspected EXAM: MRI HEAD WITHOUT CONTRAST TECHNIQUE: Multiplanar, multiecho pulse sequences of the brain and surrounding structures were obtained without intravenous contrast. COMPARISON:  None Available. FINDINGS: MRI brain: The brain volume is normal. There are confluent foci of T2 hyperintensity in the cerebral white matter. These do not have restricted diffusion. There is no acute or chronic infarct. The ventricles are normal. No mass lesion. There are normal flow signals in the carotid arteries and basilar artery. No significant bone marrow signal abnormality.  No significant abnormality in the paranasal sinuses or soft tissues.  IMPRESSION: There are multiple T2 hyperintensities within the cerebral white matter. These abnormalities are nonspecific and of uncertain etiology or clinical significance. They are more common in older patients and patients with risk factors for vascular disease. No acute infarct. Electronically Signed   By: Nancyann Burns M.D.   On: 10/13/2024 15:26   DG Chest 2 View Result Date: 10/12/2024 CLINICAL DATA:  Dizziness and disorientation. EXAM: CHEST - 2 VIEW COMPARISON:  None Available. FINDINGS: Lungs are adequately inflated and otherwise clear. Cardiomediastinal silhouette is normal. Mild degenerative change of the spine. IMPRESSION: No active cardiopulmonary disease. Electronically Signed   By: Toribio Agreste M.D.   On: 10/12/2024 17:55   CT Head Wo Contrast Result Date: 10/12/2024 CLINICAL DATA:  Altered level of consciousness, dizziness, disorientation EXAM: CT HEAD WITHOUT CONTRAST TECHNIQUE: Contiguous axial images were obtained from the base of the skull through the vertex without intravenous contrast. RADIATION DOSE REDUCTION: This exam was performed according to the departmental dose-optimization program which includes automated exposure control, adjustment of the mA and/or kV according to patient size and/or use of iterative reconstruction technique. COMPARISON:  06/27/2020 FINDINGS: Brain: Confluent hypodensities are again seen throughout the periventricular white matter, compatible with chronic small vessel ischemic changes. No evidence of acute infarct or hemorrhage. Lateral ventricles and midline structures are unremarkable. No acute extra-axial fluid collections. No mass effect. Age-appropriate cerebral cortical atrophy. Vascular: No hyperdense vessel or unexpected calcification. Skull: Normal. Negative for fracture or focal lesion. Sinuses/Orbits: No acute finding. Other: None. IMPRESSION: 1. No acute intracranial process. Electronically Signed   By: Ozell Daring M.D.   On: 10/12/2024 17:55      Discharge Exam: Vitals:   10/17/24 0909 10/17/24 0911  BP: 125/63 (!) 98/54  Pulse: 84 87  Resp:    Temp:    SpO2: 95% 96%   Vitals:   10/17/24 0445 10/17/24 0907 10/17/24 0909 10/17/24 0911  BP: (!) 152/71 (!) 162/81 125/63 (!) 98/54  Pulse:  80 84 87  Resp:  16    Temp:  98.6 F (37 C)    TempSrc:      SpO2:  97% 95% 96%    General: Pt is alert, awake, not in acute distress Cardiovascular: RRR, S1/S2 +, no rubs, no gallops Respiratory: CTA bilaterally, no wheezing, no rhonchi Abdominal: Soft, NT, ND, bowel sounds + Extremities: no edema, no cyanosis    The results of significant diagnostics from this hospitalization (including imaging, microbiology, ancillary and laboratory) are listed below for reference.     Microbiology: Recent Results (from the past 240 hours)  Resp panel by RT-PCR (RSV, Flu A&B, Covid) Anterior Nasal Swab     Status: None   Collection Time: 10/12/24  5:23 PM   Specimen: Anterior Nasal Swab  Result Value Ref Range Status   SARS Coronavirus 2 by RT PCR NEGATIVE NEGATIVE Final    Comment: (NOTE) SARS-CoV-2 target nucleic acids are NOT DETECTED.  The SARS-CoV-2 RNA is generally detectable in upper respiratory specimens during the acute phase of infection. The lowest concentration of SARS-CoV-2 viral copies this assay can detect is 138 copies/mL. A negative result does not preclude SARS-Cov-2 infection and should not be used as the sole basis for treatment or other patient management decisions. A negative result may occur with  improper specimen collection/handling, submission of specimen other than nasopharyngeal swab, presence of viral mutation(s) within the areas targeted by this assay, and inadequate number  of viral copies(<138 copies/mL). A negative result must be combined with clinical observations, patient history, and epidemiological information. The expected result is Negative.  Fact Sheet for Patients:   bloggercourse.com  Fact Sheet for Healthcare Providers:  seriousbroker.it  This test is no t yet approved or cleared by the [place]  FDA and  has been authorized for detection and/or diagnosis of SARS-CoV-2 by FDA under an Emergency Use Authorization (EUA). This EUA will remain  in effect (meaning this test can be used) for the duration of the COVID-19 declaration under Section 564(b)(1) of the Act, 21 U.S.C.section 360bbb-3(b)(1), unless the authorization is terminated  or revoked sooner.       Influenza A by PCR NEGATIVE NEGATIVE Final   Influenza B by PCR NEGATIVE NEGATIVE Final    Comment: (NOTE) The Xpert Xpress SARS-CoV-2/FLU/RSV plus assay is intended as an aid in the diagnosis of influenza from Nasopharyngeal swab specimens and should not be used as a sole basis for treatment. Nasal washings and aspirates are unacceptable for Xpert Xpress SARS-CoV-2/FLU/RSV testing.  Fact Sheet for Patients: bloggercourse.com  Fact Sheet for Healthcare Providers: seriousbroker.it  This test is not yet approved or cleared by the [place]  FDA and has been authorized for detection and/or diagnosis of SARS-CoV-2 by FDA under an Emergency Use Authorization (EUA). This EUA will remain in effect (meaning this test can be used) for the duration of the COVID-19 declaration under Section 564(b)(1) of the Act, 21 U.S.C. section 360bbb-3(b)(1), unless the authorization is terminated or revoked.     Resp Syncytial Virus by PCR NEGATIVE NEGATIVE Final    Comment: (NOTE) Fact Sheet for Patients: bloggercourse.com  Fact Sheet for Healthcare Providers: seriousbroker.it  This test is not yet approved or cleared by the [place]  FDA and has been authorized for detection and/or diagnosis of SARS-CoV-2 by FDA under an Emergency Use  Authorization (EUA). This EUA will remain in effect (meaning this test can be used) for the duration of the COVID-19 declaration under Section 564(b)(1) of the Act, 21 U.S.C. section 360bbb-3(b)(1), unless the authorization is terminated or revoked.  Performed at [facility], [address] [place], [place] [address]      Labs: BNP (last 3 results) No results for input(s): BNP in the last 8760 hours. Basic Metabolic Panel: Recent Labs  Lab 10/12/24 1824 10/13/24 0915 10/17/24 0901  NA 137  --  138  K 4.2  --  3.5  CL 100  --  105  CO2 25  --  23  GLUCOSE 98  --  116*  BUN 20  --  10  CREATININE 1.21 1.12 1.01  CALCIUM  9.2  --  8.2*   Liver Function Tests: Recent Labs  Lab 10/12/24 1824  AST 14*  ALT 10  ALKPHOS 46  BILITOT 0.7  PROT 6.9  ALBUMIN 4.4   No results for input(s): LIPASE, AMYLASE in the last 168 hours. No results for input(s): AMMONIA in the last 168 hours. CBC: Recent Labs  Lab 10/12/24 1824 10/13/24 0915 10/14/24 1005  WBC 9.0 8.1 8.0  NEUTROABS 7.0  --  6.3  HGB 13.0 12.8* 13.1  HCT 38.4* 37.9* 38.4*  MCV 90.8 90.9 90.4  PLT 189 195 190   Cardiac Enzymes: No results for input(s): CKTOTAL, CKMB, CKMBINDEX, TROPONINI in the last 168 hours. BNP: Invalid input(s): POCBNP CBG: Recent Labs  Lab 10/12/24 1753  GLUCAP 94   D-Dimer No results for input(s): DDIMER in the last 72 hours.  Hgb A1c No results for input(s): HGBA1C in the last 72 hours. Lipid Profile No results for input(s): CHOL, HDL, LDLCALC, TRIG, CHOLHDL, LDLDIRECT in the last 72 hours. Thyroid function studies No results for input(s): TSH, T4TOTAL, T3FREE, THYROIDAB in the last 72 hours.  Invalid input(s): FREET3 Anemia work up No results for input(s): VITAMINB12, FOLATE, FERRITIN, TIBC, IRON, RETICCTPCT in the last 72 hours. Urinalysis    Component Value Date/Time   COLORURINE YELLOW 10/12/2024 1723    APPEARANCEUR CLEAR 10/12/2024 1723   LABSPEC 1.015 10/12/2024 1723   PHURINE 6.0 10/12/2024 1723   GLUCOSEU NEGATIVE 10/12/2024 1723   HGBUR NEGATIVE 10/12/2024 1723   BILIRUBINUR NEGATIVE 10/12/2024 1723   KETONESUR NEGATIVE 10/12/2024 1723   PROTEINUR NEGATIVE 10/12/2024 1723   NITRITE NEGATIVE 10/12/2024 1723   LEUKOCYTESUR NEGATIVE 10/12/2024 1723   Sepsis Labs Recent Labs  Lab 10/12/24 1824 10/13/24 0915 10/14/24 1005  WBC 9.0 8.1 8.0   Microbiology Recent Results (from the past 240 hours)  Resp panel by RT-PCR (RSV, Flu A&B, Covid) Anterior Nasal Swab     Status: None   Collection Time: 10/12/24  5:23 PM   Specimen: Anterior Nasal Swab  Result Value Ref Range Status   SARS Coronavirus 2 by RT PCR NEGATIVE NEGATIVE Final    Comment: (NOTE) SARS-CoV-2 target nucleic acids are NOT DETECTED.  The SARS-CoV-2 RNA is generally detectable in upper respiratory specimens during the acute phase of infection. The lowest concentration of SARS-CoV-2 viral copies this assay can detect is 138 copies/mL. A negative result does not preclude SARS-Cov-2 infection and should not be used as the sole basis for treatment or other patient management decisions. A negative result may occur with  improper specimen collection/handling, submission of specimen other than nasopharyngeal swab, presence of viral mutation(s) within the areas targeted by this assay, and inadequate number of viral copies(<138 copies/mL). A negative result must be combined with clinical observations, patient history, and epidemiological information. The expected result is Negative.  Fact Sheet for Patients:  bloggercourse.com  Fact Sheet for Healthcare Providers:  seriousbroker.it  This test is no t yet approved or cleared by the [place]  FDA and  has been authorized for detection and/or diagnosis of SARS-CoV-2 by FDA under an Emergency Use Authorization  (EUA). This EUA will remain  in effect (meaning this test can be used) for the duration of the COVID-19 declaration under Section 564(b)(1) of the Act, 21 U.S.C.section 360bbb-3(b)(1), unless the authorization is terminated  or revoked sooner.       Influenza A by PCR NEGATIVE NEGATIVE Final   Influenza B by PCR NEGATIVE NEGATIVE Final    Comment: (NOTE) The Xpert Xpress SARS-CoV-2/FLU/RSV plus assay is intended as an aid in the diagnosis of influenza from Nasopharyngeal swab specimens and should not be used as a sole basis for treatment. Nasal washings and aspirates are unacceptable for Xpert Xpress SARS-CoV-2/FLU/RSV testing.  Fact Sheet for Patients: bloggercourse.com  Fact Sheet for Healthcare Providers: seriousbroker.it  This test is not yet approved or cleared by the [place]  FDA and has been authorized for detection and/or diagnosis of SARS-CoV-2 by FDA under an Emergency Use Authorization (EUA). This EUA will remain in effect (meaning this test can be used) for the duration of the COVID-19 declaration under Section 564(b)(1) of the Act, 21 U.S.C. section 360bbb-3(b)(1), unless the authorization is terminated or revoked.     Resp Syncytial Virus by PCR NEGATIVE NEGATIVE Final    Comment: (NOTE) Fact Sheet for Patients: bloggercourse.com  Fact Sheet for Healthcare Providers: seriousbroker.it  This test is not yet approved or cleared by the [place]  FDA and has been authorized for detection and/or diagnosis of SARS-CoV-2 by FDA under an Emergency Use Authorization (EUA). This EUA will remain in effect (meaning this test can be used) for the duration of the COVID-19 declaration under Section 564(b)(1) of the Act, 21 U.S.C. section 360bbb-3(b)(1), unless the authorization is terminated or revoked.  Performed at [facility], [address]  [place], [place] [address]     FURTHER DISCHARGE INSTRUCTIONS:   Get Medicines reviewed and adjusted: Please take all your medications with you for your next visit with your Primary MD   Laboratory/radiological data: Please request your Primary MD to go over all hospital tests and procedure/radiological results at the follow up, please ask your Primary MD to get all Hospital records sent to his/her office.   In some cases, they will be blood work, cultures and biopsy results pending at the time of your discharge. Please request that your primary care M.D. goes through all the records of your hospital data and follows up on these results.   Also Note the following: If you experience worsening of your admission symptoms, develop shortness of breath, life threatening emergency, suicidal or homicidal thoughts you must seek medical attention immediately by calling 911 or calling your MD immediately  if symptoms less severe.   You must read complete instructions/literature along with all the possible adverse reactions/side effects for all the Medicines you take and that have been prescribed to you. Take any new Medicines after you have completely understood and accpet all the possible adverse reactions/side effects.    patient was instructed, not to drive, operate heavy machinery, perform activities at heights, swimming or participation in water activities or provide baby-sitting services while on Pain, Sleep and Anxiety Medications; until their outpatient Physician has advised to do so again. Also recommended to not to take more than prescribed Pain, Sleep and Anxiety Medications.  It is not advisable to combine anxiety, sleep and pain medications without talking with your primary care provider.     Wear Seat belts while driving.   Please note: You were cared for by a hospitalist during your hospital stay. Once you are discharged, your primary care physician will handle any further medical issues.  Please note that NO REFILLS for any discharge medications will be authorized once you are discharged, as it is imperative that you return to your primary care physician (or establish a relationship with a primary care physician if you do not have one) for your post hospital discharge needs so that they can reassess your need for medications and monitor your lab values  Time coordinating discharge: Over 30 minutes  SIGNED:   Fredia Skeeter, MD  [facility] 10/17/2024, 9:47 AM *Please note that this is a verbal dictation therefore any spelling or grammatical errors are due to the Dragon Medical One system interpretation. If 7PM-7AM, please contact night-coverage [email or web address]     [1] No Known Allergies

## 2024-10-17 NOTE — Progress Notes (Signed)
 Patient ready fro DC. COLETTA Carrier reviewed discharge paperwork with patient his wife and over the phone his daughter.IV removed by NT. CCMD notified of discharge. NT dressed patient and volunteers to be called to wheel patient out.

## 2024-10-17 NOTE — Plan of Care (Signed)
  Problem: Clinical Measurements: Goal: Ability to maintain clinical measurements within normal limits will improve Outcome: Progressing Goal: Will remain free from infection Outcome: Progressing Goal: Diagnostic test results will improve Outcome: Progressing Goal: Respiratory complications will improve Outcome: Progressing Goal: Cardiovascular complication will be avoided Outcome: Progressing   Problem: Health Behavior/Discharge Planning: Goal: Ability to manage health-related needs will improve Outcome: Progressing   Problem: Activity: Goal: Risk for activity intolerance will decrease Outcome: Progressing   Problem: Nutrition: Goal: Adequate nutrition will be maintained Outcome: Progressing   Problem: Elimination: Goal: Will not experience complications related to bowel motility Outcome: Progressing Goal: Will not experience complications related to urinary retention Outcome: Progressing   Problem: Pain Managment: Goal: General experience of comfort will improve and/or be controlled Outcome: Progressing   Problem: Safety: Goal: Ability to remain free from injury will improve Outcome: Progressing   Problem: Skin Integrity: Goal: Risk for impaired skin integrity will decrease Outcome: Progressing

## 2024-11-16 NOTE — H&P (Signed)
 " HOSPITAL MEDICINE - ADMISSION NOTE  Primary Care Physician Murray CHRISTELLA Amos, MD  Primary Contact Extended Emergency Contact Information Primary Emergency Contact: Citro,Sandra Home Phone: [phone] Mobile Phone: [phone] Relation: Spouse   Chief Complaint Confusion and dizziness  History of Present Illness   Caleb Santana is a 89 y.o. male with history of hypertension, orthostatic hypotension, diabetes TIA CVA cognitive impairment, being worked up for Parkinson's by neurology who presents the hospital due to dizziness and confusion during PT.  Patient apparently states that he was having PT and started to have lightheaded and dizziness.  Denies any syncopal-like events.  Wife states that patient had confusion during that time as well.  Patient was recently hospitalized in December and was found to have orthostatic hypotension.  Taken off of diltiazem at that time.  Orthostatic blood pressure had improved.  Patient was seen by PCP past 2 to 3 days and was placed on amlodipine  due to hypertension.  Patient was taken all his medicine as scheduled.  Denies any symptoms of nausea vomiting diarrhea constipation.  Appears to be AO x 3.  In the ED Vitals revealed orthostatic hypotension BMP revealed creatinine of 1.21 CBC within normal limits UA negative CT revealed sinus rhythm. Patient was started on 1 L of normal saline -MRI pending   Review of Systems 14 point review of negative unless noted in HPI   PMFSH and Home Medications   Past Medical History  Medical History[1]  Surgical History Surgical History[2]   Family History Family History[3]  Social History Social History[4]   Allergies Ciprofloxacin, Latanoprost, and Ace inhibitors  Medications Prescriptions Prior to Admission[5]   Objective   Temp:  [97.4 F (36.3 C)] 97.4 F (36.3 C) Heart Rate:  [61-73] 73 Resp:  [13-20] 16 BP: (101-204)/(61-107) 204/86    Physical Exam Constitutional:       Appearance: He is normal weight.  HENT:     Head: Normocephalic.     Nose: Nose normal.     Mouth/Throat:     Mouth: Mucous membranes are moist.     Pharynx: Oropharynx is clear.  Eyes:     Extraocular Movements: Extraocular movements intact.     Conjunctiva/sclera: Conjunctivae normal.     Pupils: Pupils are equal, round, and reactive to light.  Cardiovascular:     Rate and Rhythm: Normal rate.     Pulses: Normal pulses.  Pulmonary:     Effort: Pulmonary effort is normal.  Abdominal:     General: Abdomen is flat. Bowel sounds are normal.     Palpations: Abdomen is soft.  Musculoskeletal:        General: Normal range of motion.     Cervical back: Normal range of motion.  Skin:    General: Skin is warm.     Capillary Refill: Capillary refill takes less than 2 seconds.  Neurological:     General: No focal deficit present.     Mental Status: He is alert. Mental status is at baseline.  Psychiatric:        Mood and Affect: Mood normal.     Results from last 7 days  Lab Units 11/16/24 1622  WHITE BLOOD CELL COUNT 10*3/uL 7.90  HEMOGLOBIN g/dL 85.7  HEMATOCRIT % 58.1  PLATELET COUNT 10*3/uL 167    Results from last 7 days  Lab Units 11/16/24 1622  SODIUM mmol/L 138  POTASSIUM mmol/L 4.2  CHLORIDE mmol/L 103  CO2 mmol/L 27  BUN mg/dL 22  CREATININE mg/dL 8.78  GLUCOSE mg/dL 895*  CALCIUM  mg/dL 9.4        No results found for this visit on 11/16/24 (from the past week).   No results found.  Assessment and Plan   Assessment & Plan Orthostatic dizziness - At this time we will discontinue amlodipine  -Repeat orthostatics in a.m. -Consult cardiology in a.m. as patient was scheduled for cardiology follow-up on 11/16/2024 -Continue compression stockings -Continue to monitor by symptoms Obesity (BMI 30-39.9) - Counseled Essential hypertension - Counseled Cerebellar ataxia    (CMD) - PT OT ordered Hyperlipidemia associated with type 2 diabetes mellitus     (CMD) - Continue home meds -Start sliding scale insulin checks q. ACHS Hyperlipidemia LDL goal <100 - Continue home medicine Stage 2 moderate COPD by GOLD classification    (CMD) - Continue home meds Cerebrovascular accident (CVA)    (CMD) - Repeat MRI given patient's confusion during episode at PT -Continue home meds Impaired cognition - Patient is scheduled for outpatient follow-up with neurology.  Currently being evaluated for possible Parkinson's disease  DVT Prophylaxis: Heparin SubQ  CODE STATUS: Full Code. Information obtained from patient  Could Caleb Santana be considered for Hospital at [facility] in the next 24 hours?  No   Anticipate > 2 midnight stay, patient will be admitted as inpatient for Orthostatic dizzinessEstimated date of discharge Nov 18, 2024    Zoaib Safdar Rasool, [place]        [1] Past Medical History: Diagnosis Date   Bronchiectasis (CMD)    Cerebellar ataxia    (CMD) 08/02/2013   05/2019: Under care of neurology   Cerebrovascular accident (CVA)    (CMD) 10/21/2016   05/2019: Under care of neurology   COPD (chronic obstructive pulmonary disease) (CMD)    Diabetes mellitus (CMD)    Difficulty urinating 05/13/2020   Diplopia 08/02/2013   Overview:  IMPRESSION: 08/28/2010 had spell of diplopia -skew deviation- some time ago.  To see [facility] for 2nd opinion.   Dry eye syndrome of both eyes 12/11/2019   DVT of deep femoral vein, right (CMD) 04/26/2019   05/2019: Under care of cardiology/coumadin clinic   Essential hypertension 07/08/2012   Gastroesophageal reflux disease 07/08/2012   Glaucoma    Hearing aid worn 06/15/2016   History of laser assisted in situ keratomileusis 02/20/2015   History of pulmonary embolism 02/10/2016   Hyperlipidemia associated with type 2 diabetes mellitus    (CMD) 07/12/2018   Hypertension    Imbalance 06/15/2016   05/2019: Under care of neurology   Insomnia with sleep apnea 08/02/2013   Overview:   IMPRESSION: Continue lunesta 2 mg qhs 08/05/2011 possibley caused rash- substitute gabapentin  08/03/2012 patient now only on clonazepam , sleeps in chair from 2030 to 2200 then goes to bed after and has fragmented sleep after 0400 and feels off kilter during teh day ? change the habit.  08/02/2013 stable on clonazepam - still has early awakening, trial of low dose ambien Overview:  IMPRESSI   Keratoconjunctivitis sicca not specified as Sjogren's, bilateral    Macular edema 10/31/2013   Multiple subsegmental pulmonary emboli without acute cor pulmonale    (CMD) 04/26/2019   05/2019: Under care of cardiology/coumadin clinic   Obstructive sleep apnea 08/02/2013   [facility] IMPRESSION: Supine OSA (AHI=13 wotj desats tp 87% sup=all occurred-non nonsupine (04-18-06)  05/2019: previously under care of pulmonology   Orthostatic hypotension 09/16/2020   OSA (obstructive sleep apnea)    Pannus (corneal), left    Pneumonia    Primary open  angle glaucoma (POAG) of both eyes, moderate stage 12/11/2019   Primary open angle glaucoma of right eye, mild stage 04/18/2014   Pulmonary embolism (CMD)    History of    Renal insufficiency, mild 05/25/2019   05/2019: He has a mild-moderate renal decline with GFR 49-62 dating back one year. No microalbuminuria.   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   Type 2 diabetes mellitus with stage 3 chronic kidney disease, without long-term current use of insulin    (CMD) 04/30/2016   VBI (vertebrobasilar insufficiency) 04/30/2016   05/2019: Under care of neurology   Vertigo 10/05/2017   05/2019: Under care of neurology  [2] Past Surgical History: Procedure Laterality Date   CARDIAC CATHETERIZATION     Procedure: CARDIAC CATHETERIZATION   CATARACT EXTRACTION Right    Procedure: YAG CAPSULOTOMY   CATARACT  EXTRACTION Bilateral    Procedure: CATARACT EXTRACTION   COLONOSCOPY     Procedure: COLONOSCOPY   CYSTOSCOPY N/A 04/30/2021   Procedure: UROLIFT;  Surgeon: Charlie Fallow Puschinsky, MD;  Location: [facility] MAIN OR;  Service: Urology;  Laterality: N/A;   ESOPHAGOGASTRODUODENOSCOPY     Procedure: ESOPHAGOGASTRODUODENOSCOPY   IRIDOTOMY / IRIDECTOMY     Procedure: IRIDOTOMY / IRIDECTOMY   NEUROPLASTY / TRANSPOSITION ULNAR NERVE AT ELBOW Right    Procedure: NEUROPLASTY / TRANSPOSITION ULNAR NERVE AT ELBOW   REFRACTIVE SURGERY Bilateral 1999   Procedure: REFRACTIVE SURGERY; Dr. Ryan  [3] Family History Adopted: Yes  Problem Relation Name Age of Onset   No Known Problems Mother     No Known Problems Father     No Known Problems Sister     No Known Problems Brother     No Known Problems Daughter     No Known Problems Son     No Known Problems Maternal Aunt     No Known Problems Maternal Uncle     No Known Problems Paternal Aunt     No Known Problems Paternal Uncle     No Known Problems Maternal Grandmother     No Known Problems Maternal Grandfather     No Known Problems Paternal Grandmother     No Known Problems Paternal Grandfather     Blindness Neg Hx     Glaucoma Neg Hx     Macular degeneration Neg Hx     Cataracts Neg Hx    [4] Social History Socioeconomic History   Marital status: Married  Tobacco Use   Smoking status: Former    Current packs/day: 0.00    Average packs/day: 1 pack/day for 19.0 years (19.0 ttl pk-yrs)    Types: Cigarettes    Start date: 11/02/1952    Quit date: 11/03/1971    Years since quitting: 53.0   Smokeless tobacco: Never  Vaping Use   Vaping status: Never Used  Substance and Sexual Activity   Alcohol use: Yes    Alcohol/week: 7.0 standard drinks of alcohol   Drug use: No   Social Drivers of Health   Living Situation: Low Risk (10/18/2024)   Living Situation    What is your living situation today?: I have a  steady place to live  Food Insecurity: Low Risk (10/18/2024)   Food vital sign    Within the past 12 months, you worried that your food would run out before you got money to buy  more: Never true    Within the past 12 months, the food you bought just didn't last and you didn't have money to get more: Never true  Transportation Needs: No Transportation Needs (10/18/2024)   Transportation    In the past 12 months, has lack of reliable transportation kept you from medical appointments, meetings, work or from getting things needed for daily living? : No  Utilities: Low Risk (10/18/2024)   Utilities    In the past 12 months has the electric, gas, oil, or water company threatened to shut off services in your home? : No  Safety: Low Risk (10/18/2024)   Safety    How often does anyone, including family and friends, physically hurt you?: Never    How often does anyone, including family and friends, insult or talk down to you?: Never    How often does anyone, including family and friends, threaten you with harm?: Never    How often does anyone, including family and friends, scream or curse at you?: Never  Alcohol Screening: Alcohol Misuse (10/03/2024)   Alcohol    Audit C Alcohol risk score: 4  Tobacco Use: Medium Risk (11/07/2024)   Patient History    Smoking Tobacco Use: Former    Smokeless Tobacco Use: Never  Depression: Not At Risk (11/07/2024)   PHQ-2    PHQ-2 Score: 0  Social Connections: Unknown (10/13/2024)   Received from [facility]   Social Connection and Isolation Panel    In a typical week, how many times do you talk on the phone with family, friends, or neighbors?: Patient declined    How often do you get together with friends or relatives?: Patient declined    How often do you attend church or religious services?: Patient declined    Do you belong to any clubs or organizations such as church groups, unions, fraternal or athletic groups, or school groups?: Patient  declined    How often do you attend meetings of the clubs or organizations you belong to?: Patient declined  [5] (Not in a hospital admission) "

## 2024-11-18 NOTE — Discharge Summary (Signed)
 "    General Medicine  Discharge Summary   Name: Caleb Santana Age: 89 yrs  MRN: [id number] DOB: August 06, 1935  Admit Date: 11/16/2024 Admitting Physician: Zoaib Safdar Rasool, DO  Discharge Date: 11/18/2024 Discharge Physician: Daneen MARLA Fairly, MD    Discharge Diagnoses:   Principal Problem:   Autonomic dysfunction Active Problems:   Obesity (BMI 30-39.9)   Essential hypertension   Cerebellar ataxia    (CMD)   Hyperlipidemia associated with type 2 diabetes mellitus    (CMD)   Hyperlipidemia LDL goal <100   Stage 2 moderate COPD by GOLD classification    (CMD)   Cerebrovascular accident (CVA)    (CMD)   Impaired cognition Resolved Problems:   * No resolved hospital problems. *   TO DO List at Follow-up for PCP/Specialist:   Key Medication changes: as below Pending labs to follow up on:     Hospital Course:   For full details, please see H&P, progress notes, consult notes and ancillary notes. Briefly  Caleb Santana is an 89 year old male with a history of hypertension, orthostatic hypotension, type 2 diabetes mellitus with stage 3 chronic kidney disease, cerebrovascular accident, transient ischemic attack, cerebellar ataxia, chronic obstructive pulmonary disease, and vertebrobasilar insufficiency who was admitted for evaluation of dizziness, confusion, and orthostatic symptoms during physical therapy.  The principal problem during this admission was determined to be autonomic dysfunction, with significant orthostatic hypotension and intermittent confusion. Initial evaluation included orthostatic vital signs in the ED, which revealed a systolic blood pressure drop of 70 mmHg from supine to standing, without significant heart rate change. Laboratory studies showed stable renal function and no acute abnormalities. Imaging included a brain MRI, which was negative for acute cerebrovascular accident, and prior CTA of the head and neck demonstrated atherosclerotic plaques. Cardiology  was consulted and concurred with a likely combination of autonomic dysfunction and medication-related effects, with possible neurological contribution.  To address the principal problem, amlodipine  was discontinued due to its contribution to orthostatic hypotension, and metoprolol succinate 25 mg at bedtime was initiated to assist with blood pressure control while minimizing daytime orthostatic symptoms. Nonpharmacologic measures included encouragement of oral hydration and consistent use of compression stockings, with recommendations to progress to thigh-high, higher-strength stockings. Physical therapy focused on global strengthening, balance, and gait training, with the patient demonstrating progress toward mobility and safety goals.  Secondary issues managed during the admission included essential hypertension, hyperlipidemia, cerebellar ataxia, impaired cognition, and stage 2 moderate COPD, all of which were monitored and addressed per standard protocols. Diabetes management included sliding scale insulin checks, and the patient remained fully anticoagulated for prior DVT/PE history. No acute infection or pain issues were identified, and fall precautions were maintained throughout the stay. The patient was scheduled for outpatient neurology follow-up for further evaluation of cognitive impairment and possible Parkinson's disease, and outpatient cardiology follow-up was arranged. Discharge planning included home health support and continued multidisciplinary care for chronic conditions and mobility needs.     Readmission risk score (calculated)  Predictive Model Details        18.3% (Medium)  Factor Value   Calculated 11/18/2024 12:04 14% Number of appointments in last 90 days 13   Readmission Risk Score v2 Model 10% Number of hospitalizations in last year 0    7% Diagnosis of fluid or electrolyte disorder present    7% Latest RDW in last 72 hrs 14.1 %    5% Latest hemoglobin in last 72 hrs 14.2  g/dL  The patient's chronic medical conditions were treated accordingly per the patient's home medication regimen except as noted in the plan above and in the medication list below.    Discharge Condition:   Disposition: Patient discharged to Home or Self Care in good condition.  Diet at discharge:    Dietary Orders  (From admission, onward)               Ordered    Adult Diet- Sodium Controlled; 4 gm Na limit  Diet effective now       References:    Medical Nutrition Management (MNM) for Registered Dietitian  Question Answer Comment  Diet type: Sodium Controlled   Sodium restriction: 4 gm Na limit   Medical Nutrition Management By RD: Yes, Medical Nutrition Management By RD      11/17/24 0001            Activity at Discharge: Ambulate ad lib   Physical Exam at Discharge   BP (!) 177/79 (BP Location: Right arm, Patient Position: Sitting)   Pulse 64   Temp 98.2 F (36.8 C) (Oral)   Resp 17   Ht 1.676 m (5' 6)   Wt 62.9 kg (138 lb 10.7 oz)   SpO2 96%   BMI 22.38 kg/m   At the time of discharge, the patient was seen and examined by the attending physician, and was deemed appropriate for discharge.  On examination patient appeared in good health and spirits Vital signs as documented. Skin as mentioned in nursing notes Neck without JVD.  Lungs clear to auscultation b/l, no crackles, rales or wheezes Heart exam notable for regular rhythm, normal sounds and absence of murmurs, rubs or gallops.  Abd: soft NT/ND, BS +ve Extremities nonedematous.    Discharge Medications:      Medication List     START taking these medications    metoprolol succinate 25 mg 24 hr tablet Commonly known as: TOPROL XL Take 1 tablet (25 mg total) by mouth daily.       CHANGE how you take these medications    albuterol  HFA 90 mcg/actuation inhaler Commonly known as: PROVENTIL  HFA;VENTOLIN  HFA;PROAIR  HFA INHALE 2 PUFFS INTO THE LUNGS EVERY SIX HOURS AS NEEDED FOR  WHEEZING What changed: See the new instructions.   clonazePAM  1 mg tablet Commonly known as: KlonoPIN  Take 1 tablet (1 mg total) by mouth once as needed for anxiety. What changed:  when to take this reasons to take this   cyanocobalamin  1,000 mcg tablet Commonly known as: VITAMIN B12 Take 2 tablets (2,000 mcg total) by mouth daily.   rosuvastatin  10 mg tablet Commonly known as: CRESTOR  TAKE 1 TABLET BY MOUTH DAILY FOR CHOLESTEROL What changed: when to take this   * magnesium chloride 71.5 mg Tbec tablet Commonly known as: SLOW-MAG Take 1 tablet by mouth every evening. What changed: Another medication with the same name was changed. Make sure you understand how and when to take each.   * Slow-Mag 71.5 mg Tbec tablet Generic drug: magnesium chloride Take 2 tablets in the morning and 1 tablet in the evening What changed:  how much to take how to take this when to take this   Xarelto  15 mg tablet Generic drug: rivaroxaban  TAKE 1 TABLET (15 MG TOTAL) BY MOUTH DAILY WITH DINNER. What changed: See the new instructions.      * * This list has 2 medication(s) that are the same as other medications prescribed for you. Read the directions carefully, and ask  your doctor or other care provider to review them with you.          CONTINUE taking these medications    aspirin  81 mg EC tablet Take 81 mg by mouth daily.   blood pressure test kit-medium Kit Check blood pressure as needed. Icd - I10   Breo Ellipta  100-25 mcg/dose inhaler Generic drug: fluticasone  furoate-vilanteroL Inhale 1 puff daily.   loratadine  10 mg tablet Commonly known as: CLARITIN  Take 10 mg by mouth daily.   nitroglycerin 0.4 mg SL tablet Commonly known as: NITROSTAT Place 0.4 mg under the tongue every 5 (five) minutes as needed for chest pain.   sertraline  25 mg tablet Commonly known as: ZOLOFT  TAKE 1 TABLET BY MOUTH DAILY   Spiriva with HandiHaler 18 mcg per inhalation Generic drug:  tiotropium Place 1 capsule into inhaler and inhale in the morning. (After placing 1 capsule in device, press green button, and immediately take 2 inhalations. Do NOT swallow capsule.).       STOP taking these medications    amLODIPine  2.5 mg tablet Commonly known as: NORVASC    DILT-XR 120 mg Cder Generic drug: dilTIAZem XR         Where to Get Your Medications     These medications were sent to [id number] - [place], [place] - [address] AT [facility] OF [address] - PHONE: [phone] - FAX: [phone]  [address], [place] [address]    Phone: [phone]  clonazePAM  1 mg tablet metoprolol succinate 25 mg 24 hr tablet     Significant Diagnostic Tests:   LABS:  Lab Results  Component Value Date   WBC 7.90 11/16/2024   HGB 14.2 11/16/2024   HCT 41.8 11/16/2024   PLT 167 11/16/2024   CHOL 136 07/25/2024   TRIG 93 07/25/2024   HDL 54 (L) 07/25/2024   LDLDIRECT 48 10/02/2022   ALT 9 11/16/2024   AST 12 (L) 11/16/2024   NA 139 11/18/2024   K 3.7 11/18/2024   CL 105 11/18/2024   CREATININE 0.99 11/18/2024   BUN 17 11/18/2024   CO2 29 11/18/2024   TSH 1.283 07/25/2024   PSA 0.73 05/13/2020   INR 2.60 06/21/2023   HGBA1C 5.7 (H) 11/17/2024   IMAGING:  MRI Brain WO Contrast  Final Result by Caleb Ferrari, DO (01/16 0230)  Date of Service: 2024-11-16 17:31:00    EXAM:  MRI Brain, without Contrast    DATE/ TIME:  11/16/2024, 9:02 PM    INDICATION:  Altered mental status    TECHNIQUE:  Multisequence, multiplanar noncontrast MR imaging of the brain   was performed.    COMPARISON:  MRI Brain 10/10/2024.      FINDINGS:  There is no diffusion abnormality to suggest recent infarction   or hypoxic-ischemic injury.  The brain exhibits moderate atrophy with   cortical thinning.  There is no signal abnormality to suggest acute or   chronic extravascular blood products.  There is no mass or mass effect.    Periventricular T2/  FLAIR hyperintense signal is redemonstrated compatible   changes related to chronic microangiopathy.  There is no cerebellar   tonsillar ectopia.  Normal vascular flow voids are seen.  Orbital   structures are unremarkable.  A 2.0 x 1.1 x 1.4 cm mucous retention cyst   on the floor the right maxillary sinus is noted.  Remaining paranasal   sinuses are clear along with the mastoid air cells.  IMPRESSION:  1.  MR imaging of the brain shows no acute intracranial abnormality.  2.  Atrophy with chronic white-matter ischemic change statistically   unchanged from reference exam 5 weeks ago.    Electronically signed by: Caleb Ferrari, DO on 11/17/2024 02:30:00 AM   US [place]     CULTURES:  No results found for this visit on 11/16/24 (from the past week).  Surgeries/Procedures:      Consults:   IP CONSULT TO HOSPITALIST IP CONSULT TO CARDIOLOGY   Follow-up Appointments:     Future Appointments  Date Time Provider Department Center  11/20/2024 12:45 PM Lamarr JAYSON Pon [facility] OPPT EL [facility] [address]  11/21/2024 11:00 AM Sharri Fortune, MD [facility] NEU WST [facility] [place]  11/22/2024 10:30 AM WFPREM DEXA1 WFPI RAD MG W[facility]  11/23/2024 12:45 PM Lamarr C Bury [facility] OPPT EL WFB [address]  11/29/2024  1:00 PM Eldon CHRISTELLA Mediate, OT/L [facility] OP OT WFB [address]  11/29/2024  2:15 PM Lucie Hummer, PT [facility] OPPT EL WFB [address]  12/01/2024 12:15 PM Lamarr JAYSON Pon [facility] OPPT EL WFB [address]  12/06/2024  3:00 PM Lucie Hummer, PT [facility] OPPT EL WFB [address]  12/06/2024  4:00 PM Eldon CHRISTELLA Mediate, OT/L [facility] OP OT WFB [address]  12/08/2024 10:30 AM Alexa KATHEE Counter, MD [facility] CAR HP W[facility] [address]  12/08/2024 12:15 PM Lamarr JAYSON Pon [facility] OPPT EL WFB [address]  12/11/2024  1:30 PM [facility] VISUAL FIELD [facility] OPH OH WFB [address]  12/11/2024  1:50 PM Ozell Lemond Sar, MD [facility] OPH [facility] [address]  12/12/2024  3:45 PM Lucie Hummer, PT [facility] OPPT EL WFB [address]  12/14/2024  3:00 PM Lucie Hummer, PT [facility] OPPT EL WFB [address]   12/14/2024  4:00 PM Eldon CHRISTELLA Mediate, OT/L [facility] OP OT WFB [address]  12/18/2024  1:00 PM Eldon CHRISTELLA Mediate, OT/L [facility] OP OT WFB [address]  12/18/2024  2:15 PM Lucie Hummer, PT [facility] OPPT EL WFB [address]  12/25/2024 12:45 PM Lucie Hummer, PT [facility] OPPT EL WFB [address]  12/29/2024 11:00 AM Lucie Hummer, PT [facility] OPPT EL WFB [address]  12/29/2024 12:15 PM Eldon CHRISTELLA Mediate, OT/L [facility] OP OT WFB [address]  01/02/2025  2:30 PM Murray CHRISTELLA Amos, MD [facility] PRE [facility]     Appointments which have been scheduled for you    Nov 20, 2024 12:45 PM TREATMENT with Lamarr JAYSON Pon [facility] - Outpatient Physical Therapy (WFB [address] - [place]) [address] [place] [address] [phone]     Nov 21, 2024 11:00 AM Office Visit with Sharri Fortune, MD [facility]  - Neurology [facility] [place] [place]) [address] [place] [address] [phone]  Please arrive 15 minutes prior to your scheduled visit.    Nov 22, 2024 10:30 AM Dexa Scan 1 Or More Sites Axial with [facility] - Radiology Mammogram [facility] [place]) [address] [place] [address] [phone]     Nov 23, 2024 12:45 PM TREATMENT with Lamarr JAYSON Pon [facility] - Outpatient Physical Therapy (WFB [address] - [place]) [address] [place] [address] [phone]     Nov 29, 2024 1:00 PM TREATMENT with Eldon CHRISTELLA Mediate, OT/L [facility] - Outpatient Occupational Therapy (WFB [address] - [place]) [address] [place] [address] [phone]     Nov 29, 2024 2:15 PM TREATMENT with Lucie Hummer, PT [facility] - Outpatient  Physical Therapy (WFB [address] - [place]) [address] [place] [address] [phone]     Dec 01, 2024 12:15 PM TREATMENT with Lamarr JAYSON Pon [facility] - Outpatient Physical Therapy (WFB [address] - [place]) [address] [place] [address] [phone]     Dec 06, 2024 3:00 PM TREATMENT with Lucie Hummer, PT [facility] - Outpatient Physical Therapy (WFB [address] - [place]) [address] [place] [address] [phone]     Dec 06, 2024 4:00 PM TREATMENT with Eldon CHRISTELLA Mediate, OT/L [facility] - Outpatient Occupational Therapy (WFB [address] - [place]) [address] [place] [address] [phone]     Dec 08, 2024 10:30 AM Hospital Follow Up with Alexa KATHEE Counter, MD [facility]  - Cardiology [facility] [address] - [place]) [address] [place] [address] [phone]     Dec 08, 2024 12:15 PM TREATMENT with Lamarr JAYSON Pon [facility] - Outpatient Physical Therapy (WFB [address] - [place]) [address] [place] [address] [phone]     Dec 11, 2024 1:30 PM Visual Field with IZELL CRAFT [facility] VISUAL FIELD [facility]  - Ophthalmology [facility] [address] [facility]) [address] [place] [address] [phone]     Dec 11, 2024 1:50 PM Visual Field with Ozell Lemond Sar, MD [facility]  - Ophthalmology [facility] [address] [facility]) [address] [place] [address] [phone]     Dec 12, 2024 3:45 PM TREATMENT with Lucie Hummer, PT [facility] - Outpatient Physical Therapy (WFB [address] - [place]) [address] [place] [address] [phone]     Dec 14, 2024 3:00 PM TREATMENT with Lucie Hummer, PT [facility] - Outpatient Physical Therapy (WFB [address] - [place]) [address] [place] [address] [phone]     Dec 14, 2024 4:00 PM TREATMENT with Eldon CHRISTELLA Mediate, OT/L [facility] - Outpatient Occupational Therapy (WFB [address] - [place]) [address] [place] [address] [phone]     Dec 18, 2024 1:00 PM TREATMENT with Eldon CHRISTELLA Mediate, OT/L [facility] - Outpatient Occupational Therapy (WFB [address] - [place]) [address] [place] [address] [phone]     Dec 18, 2024 2:15 PM TREATMENT with  Lucie Hummer, PT [facility] [employer] [facility] - Outpatient Physical Therapy (WFB [address] - [place]) [address] [place] [address] [phone]     Dec 25, 2024 12:45 PM TREATMENT with Lucie Hummer, PT [facility] - Outpatient Physical Therapy (WFB [address] - [place]) [address] [place] [address] [phone]     Dec 29, 2024 11:00 AM TREATMENT with Lucie Hummer, PT [facility] - Outpatient Physical Therapy (WFB [address] - [place]) [address] [place] [address] [phone]     Dec 29, 2024 12:15 PM TREATMENT with Eldon CHRISTELLA Mediate, OT/L [facility] - Outpatient Occupational Therapy (WFB [address] - [place]) [address] [place] [address] [phone]     Jan 02, 2025 2:30 PM Office Visit with Murray CHRISTELLA Amos,  MD [facility]  - Internal Medicine [facility] ([facility] [place] [address] [place] [address] [phone]  Please arrive 15 minutes prior to your scheduled visit.         Time spent on discharge: 45 min  this includes but not limited to 50% face to face time, time reviewing vitals, medications, lab findings, imaging, discussing with Pharmacy about current medications and reconciliation and discussing the management plan with the nursing staff and, case manager/ SW, patient at the bedside.  Electronically signed by:   Kinchit K. Maree, MD FACP Assistant Professor of Medicine Diplomate of American Board of Internal Medicine [facility] of Medicine Department of Internal Medicine, Section of Hospital Medicine 11/18/2024 12:06 PM    *Some images could not be shown."

## 2024-11-21 NOTE — Progress Notes (Signed)
 Follow Up Note 11/21/2024  Reason for Visit  Return in about 6 months (around 10/24/2024).**dual coverage/ccw**  Assessment  1) Orthostatic hypotension.  Unimproved.  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  No other clear associated symptoms to suggest neurodegenerative process such as multisystem atrophy (MSA).  Would not exclude contributions from prediabetes.  We reviewed pathophysiology, treatment concepts and options as well as limitations and prognosis at length once again today.  Explained that any diagnosis promoting orthostasis still will require continued supportive measures as currently being pursued.  Recommended continued use of staged standing measures and possible isometric exercises prior to standing up. Tilt table testing completed 08/08/2020 confirmed orthostatic hypotension.  Previous EEG x2 unrevealing.  No overt arrhythmia corresponding to spells on Zio patch rhythm monitoring in September 2025.  Deferred continued management of symptomatic orthostatic hypotension to medical services at this time.  Daughter expressed some seminal interest in pursuing second opinion with [facility], and was encouraged to do so at direction of PCP if felt appropriate.  All posed questions answered.  2) REM sleep behavior disorder.  Remains adherent with clonazepam .  Some questionable tolerability issues with melatonin low-dose trial.  No clear indication of diffuse Lewy body dementia.  Recommended formal sleep specialist evaluation.    3) Foot discomfort.  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  May next consider NCS. Would not exclude contribution to sense of imbalance.  4) Confusion.  Possible expression of an emerging dementia.  May consider introduction of cholinesterase inhibitors.  Recommended continued allowance for convalescence from recent hospitalization, and make sure there are no considerable contraindications to introduction of this class of medication from a  cardiology standpoint.  Next: NCS, CTA, TCD, CD, sleep referral, SSRI, MoCA test, ATN profile, donepezil, neuropsych testing, skin biopsy  Plan  1) Consider introduction of low-dose donepezil.  I plan to see Caleb Santana back for Return in about 6 months (around 05/21/2025).  He understands to call me for any questions or concerns.   ___________ L. Charlyne, MD Certifications in Neurology, Clinical Neurophysiology, Neuroimaging  HPI  Caleb Santana is a 89 y.o. WM with a history of dizziness. He is accompanied by his wife today, as well as by his daughter by phone today.  Patient completed routine EEG on 07/06/24.  Background slow in frontal beta only.  No epileptiform changes.  Orthostatic blood pressures again positive last visit.  Zio patch cardiac rhythm monitoring completed from 9/5-19/25. Seven triggered events with sinus, SVE or VE spells only.  Patient was recently hospitalized from 1/15-17/26, due to experiencing orthostatic symptoms during a session of PT.  Patient diagnosed tentatively with autonomic dysfunction after cardiology reevaluation.  Patient had adjustment of his antihypertensive regimen to metoprolol with which he remains adherent.  Were reviewed including increased fluid intake and compression stockings.  PT focused on global strengthening, balance and gait training.  Patient was noted to have some confusion.  Concern raised for possible Parkinson's disease.  Prior to this patient was hospitalized from 12/11-16/25 for acute evaluation of dizziness and confusion as well as speech difficulty prompting evaluation.  Noted to have confusion and difficulty communicating at approximately 4:15 PM on date of admission, noted to have been shopping earlier in the day.  Denied similar experiences in the past.  Reported by family to have periods of blank look and unsteadiness typically lasting about 30 minutes with resumption of normal thereafter.  Noted to be bradycardic and  hypertensive in the ED.  Patient admitted for stroke workup.  MRI was negative.  Echo with normal ejection fraction, no PFO.  EEG negative for epileptiform activity.  Found to have significant orthostasis with PT.  Suggested possibility of testing for Parkinson's.  Mild relative degree of sinus bradycardia not felt to explain enough of patient's dizzy symptoms.  History of PE for which she is on OAC.  Family reports patient is disoriented in the AMs, intermittently during the day Worsened since the holidays Periods of confusion whereas other times he seems to be doing okay Was confused yesterday in the AM, urinated in the kitchen floor,  Was confused at hospital  Daughter describes calling out for wife, rigid while standing, death grip at the door frame, blank stare, relaxed  Previously underwent CTA of the intracranial vessels on 06/27/2020 indicating 30-50% stenosis of bilateral vertebral artery origins. Has undergone transcranial Dopplers on 08/19/2016. 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  Previously not convinced historically of profound improvement of dizzy spells with use of compression stockings  Initial Hx: Still with imbalance Generalized dizziness Usually daily, on feet May ease with recumbency, takes about 15 minutes No trials of compression stocking Encouraged fluid by previous neurologist Occasional Gatorade use Recent orthostatics Last CD with neurology Does not recall any rhythm monitoring No Tilt table testing No dietary restrictions or supplements Recent months having dizziness, requiring stopping, bolstering upon furniture, only when on feet While looking up had a spell of dizzy senation, held onto stair railing Now taking 1/2 tab of clonazepam  for REM sleep behavior disorder, and wife notes that with the dose reduction he has only had 1 difficult night  Occasional tingling in bottom of feet ongoing for several months Denies any discoloration,  weakness Borderline DM mentioned for years  Seen by PT at [facility], recommended evaluation by neuro-optician Seen by Dr. Denyce in [facility], a neuro-optician 8-9 yrs ago ruled out vestibular issue at [facility]  Denies any hallucinations, memory changes No changes in continence Denies any exposure to solvents, chemicals, heavy metals or recurrent head trauma Does have some dry eyes but no recurrent problematic xerophthalmia or xerostomia, no issues with weakness which improves with repeated effort to suggest Lambert-Eaton Denies any difficulty with changes in sweat patterns or salivary changes No difficulty with palpitations, history of problematic arrhythmias  Last TTE 10/06/2018 with ejection fraction of 60-65%, normal LV size, no systolic or diastolic dysfunction  Reviewed last PCP note from 04/24/2020 noting worsening balance.  Recall seeing a neurologist in the past and wishes to reestablish with neurology.  Has undergone PT in the past.  Reports 1 month of tingling and numbness of the soles of both feet.  No pain.  Normal monofilament exam of bilateral feet.  Reviewed last neurology note with Dr. Daralyn from 10/21/2016.  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  Carotid Dopplers previously 1-39% stenoses.  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  Patient managed for insomnia. Had been taking clonazepam  in 2015.  Melatonin caused mood disorders.  No RLS.  Previous episode of vertigo in 2016.  Noted to have REM sleep behavior disorder, diabetes, OSA, insomnia, hypertension, diplopia, cerebellar ataxia.  Medications at the time included clonazepam  1-2 mg nightly.  Recommendation was for 1 year follow-up.  Last available MRI brain 08/19/2016.  Images independently reviewed and interpreted.  Moderate degree of chronic microvascular ischemic changes with left parieto-occipital  larger subcortical infarct apparent.  Possible iron deposition of  the cerebellar vermis and basal ganglia bilaterally.  Exam  BP 151/77 (BP Location: Right arm, Patient Position: Sitting)   Pulse 70   SpO2 94%   Elderly white male in NAD. Afebrile. Cooperative with examiner. [place]/AT. Anicteric. Lids normal. No audible wheeze. No accessory respiratory muscle use. No rash in arms. No ecchymoses of arms/hands.   Language intact. Affect full. Mood euthymic. EOMI. PER.  Face symmetric. Phonation intact. Soft palate elevation symmetric.  Symmetric shoulder shrug.  No pronator drift. No resting tremor seen. No bradykinesia.  No increased tone in upper or lower extremities with Froment maneuver. Strength symmetric and full in upper and lower extremities. Gait cautious, assisted by roller walker.   Sensory exam symmetrically intact to UE proprioception; to cold in UEs.  Vibratory intact in all 4 distal extremities.    PM/SH  Allergies[1] Current Rx[2] Medical History[3] Surgical History[4] Family History[5]  LAB DATA   Lab Results  Component Value Date   WBC 7.90 11/16/2024   HGB 14.2 11/16/2024   HCT 41.8 11/16/2024   PLT 167 11/16/2024   CHOL 136 07/25/2024   TRIG 93 07/25/2024   HDL 54 (L) 07/25/2024   LDLDIRECT 48 10/02/2022   ALT 9 11/16/2024   AST 12 (L) 11/16/2024   NA 139 11/18/2024   K 3.7 11/18/2024   CL 105 11/18/2024   CREATININE 0.99 11/18/2024   BUN 17 11/18/2024   CO2 29 11/18/2024   TSH 1.283 07/25/2024   PSA 0.73 05/13/2020   INR 2.60 06/21/2023   HGBA1C 5.7 (H) 11/17/2024   Admission on 11/16/2024, Discharged on 11/18/2024  Component Date Value Ref Range Status   Sodium 11/16/2024 138  136 - 145 mmol/L Final   Potassium 11/16/2024 4.2  3.4 - 4.5 mmol/L Final   Chloride 11/16/2024 103  98 - 107 mmol/L Final   CO2 11/16/2024 27  21 - 31 mmol/L Final   High lactate dehydrogenase (LDH) concentrations in patient samples may cause falsely increased  bicarbonate results. If markedly elevated LDH levels are suspected, please assess results in conjunction with patient's LDH values.   Anion Gap 11/16/2024 8  6 - 14 mmol/L Final   Glucose, Random 11/16/2024 104 (H)  70 - 99 mg/dL Final   Blood Urea Nitrogen (BUN) 11/16/2024 22  7 - 25 mg/dL Final   Creatinine 98/84/7973 1.21  0.70 - 1.30 mg/dL Final   eGFR 98/84/7973 58 (L)  >59 mL/min/1.15m2 Final   GFR estimated by CKD-EPI equations(NKF 2021).   Recommend confirmation of Cr-based eGFR by using Cys-based eGFR and other filtration markers (if applicable) in complex cases and clinical decision-making, as needed.   Albumin 11/16/2024 4.5  3.5 - 5.7 g/dL Final   Total Protein 98/84/7973 6.9  6.4 - 8.9 g/dL Final   Bilirubin, Total 11/16/2024 0.6  0.3 - 1.0 mg/dL Final   Alkaline Phosphatase (ALP) 11/16/2024 38  34 - 104 U/L Final   Aspartate Aminotransferase (AST) 11/16/2024 12 (L)  13 - 39 U/L Final   Alanine Aminotransferase (ALT) 11/16/2024 9  7 - 52 U/L Final   Calcium  11/16/2024 9.4  8.6 - 10.3 mg/dL Final   BUN/Creatinine Ratio 11/16/2024    Final   Creatinine is normal, ratio is not clinically indicated.    Color, Urine 11/16/2024 Yellow  Yellow Final   Clarity, Urine 11/16/2024 Clear  Clear Final   Specific Gravity, Urine 11/16/2024 1.014  1.005 - 1.025 Final   pH, Urine 11/16/2024 6.0  5.0 - 8.0 Final  Protein, Urine 11/16/2024 Negative  Negative, 10 , 20  mg/dL Final   Glucose, Urine 11/16/2024 Negative  Negative, 30 , 50  mg/dL Final   Ketones, Urine 11/16/2024 Negative  Negative, Trace mg/dL Final   Bilirubin, Urine 11/16/2024 Negative  Negative Final   Blood, Urine 11/16/2024 Negative  Negative, Trace Final   Nitrite, Urine 11/16/2024 Negative  Negative Final   Leukocyte Esterase, Urine 11/16/2024 Negative  Negative, 25  Final   Urobilinogen, Urine 11/16/2024 Normal  <2.0 mg/dL Final   WBC 98/84/7973 7.90  4.40 - 11.00 10*3/uL Final   RBC  11/16/2024 4.70  4.50 - 5.90 10*6/uL Final   Hemoglobin 11/16/2024 14.2  14.0 - 17.5 g/dL Final   Hematocrit 98/84/7973 41.8  41.5 - 50.4 % Final   Mean Corpuscular Volume (MCV) 11/16/2024 88.8  80.0 - 96.0 fL Final   Mean Corpuscular Hemoglobin (MCH) 11/16/2024 30.1  27.5 - 33.2 pg Final   Mean Corpuscular Hemoglobin Conc (* 11/16/2024 33.9  33.0 - 37.0 g/dL Final   Red Cell Distribution Width (RDW) 11/16/2024 14.1  12.3 - 17.0 % Final   Platelet Count (PLT) 11/16/2024 167  150 - 450 10*3/uL Final   Mean Platelet Volume (MPV) 11/16/2024 8.8  6.8 - 10.2 fL Final   Neutrophils % 11/16/2024 79  % Final   Lymphocytes % 11/16/2024 11  % Final   Monocytes % 11/16/2024 6  % Final   Eosinophils % 11/16/2024 3  % Final   Basophils % 11/16/2024 0  % Final   Neutrophils Absolute 11/16/2024 6.30  1.80 - 7.80 10*3/uL Final   Lymphocytes # 11/16/2024 0.90 (L)  1.00 - 4.80 10*3/uL Final   Monocytes # 11/16/2024 0.50  0.00 - 0.80 10*3/uL Final   Eosinophils # 11/16/2024 0.20  0.00 - 0.50 10*3/uL Final   Basophils # 11/16/2024 0.00  0.00 - 0.20 10*3/uL Final   Amphetamines Screen, Urine 11/16/2024 Negative  Negative Final   Cutoff = 1000 ng/mL   Barbiturates Screen, Urine 11/16/2024 Negative  Negative Final   Cutoff = 200 ng/mL   Benzodiazepines Screen, Urine 11/16/2024 Negative  Negative Final   Cutoff = 200 ng/mL   Buprenorphine Screen, Urine 11/16/2024 Negative  Negative Final   Cutoff = 5 ng/mL   Cocaine Screen, Urine 11/16/2024 Negative  Negative Final   Cutoff = 300 ng/mL   Methadone Screen, Urine 11/16/2024 Negative  Negative Final   Cutoff = 300 ng/mL   Opiates Screen, Urine 11/16/2024 Negative  Negative Final   Cutoff = 300 ng/mL   Fentanyl Screen, Urine 11/16/2024 Negative  Negative Final   Cutoff = 1 ng/mL   Marijuana (THC) Screen, Urine 11/16/2024 Negative  Negative Final   Cutoff = 50 ng/mL   Oxycodone Screen, Urine 11/16/2024 Negative  Negative Final    Cutoff = 100 ng/mL   Creatinine, Urine 11/16/2024 76  >=20 mg/dL Final   Hemoglobin J8r 11/16/2024 5.8 (H)  <5.7 % Final   Normal A1c:             Less than 5.7%  Prediabetes range A1c:  5.7% to 6.4%  Diabetes range A1c:     Greater than 6.4%   Estimated Average Glucose 11/16/2024 120  mg/dL Final   The ADA has supported the calculation of Average Glucose (eAG) based on HbA1c measurements. However, the eAG reflects the average glycemic level over 2-3 months and it would not necessarily match single glucose laboratory measurements, nor reflect changes in the daily glucose concentration.  Glucose, POC 11/16/2024 82  70 - 99 mg/dL Final   Recheck/confirm   Hemoglobin A1c 11/17/2024 5.7 (H)  <5.7 % Final   Normal A1c:             Less than 5.7%  Prediabetes range A1c:  5.7% to 6.4%  Diabetes range A1c:     Greater than 6.4%   Estimated Average Glucose 11/17/2024 117  mg/dL Final   The ADA has supported the calculation of Average Glucose (eAG) based on HbA1c measurements. However, the eAG reflects the average glycemic level over 2-3 months and it would not necessarily match single glucose laboratory measurements, nor reflect changes in the daily glucose concentration.   Sodium 11/17/2024 141  136 - 145 mmol/L Final   Potassium 11/17/2024 3.5  3.4 - 4.5 mmol/L Final   Chloride 11/17/2024 106  98 - 107 mmol/L Final   CO2 11/17/2024 27  21 - 31 mmol/L Final   High lactate dehydrogenase (LDH) concentrations in patient samples may cause falsely increased bicarbonate results. If markedly elevated LDH levels are suspected, please assess results in conjunction with patient's LDH values.   Anion Gap 11/17/2024 8  6 - 14 mmol/L Final   Glucose, Random 11/17/2024 105 (H)  70 - 99 mg/dL Final   Blood Urea Nitrogen (BUN) 11/17/2024 19  7 - 25 mg/dL Final   Creatinine 98/83/7973 0.95  0.70 - 1.30 mg/dL Final   eGFR 98/83/7973 77  >59 mL/min/1.61m2 Final   GFR estimated by CKD-EPI equations(NKF  2021).   Recommend confirmation of Cr-based eGFR by using Cys-based eGFR and other filtration markers (if applicable) in complex cases and clinical decision-making, as needed.   Calcium  11/17/2024 8.7  8.6 - 10.3 mg/dL Final   BUN/Creatinine Ratio 11/17/2024    Final   Creatinine is normal, ratio is not clinically indicated.    Magnesium 11/17/2024 1.8 (L)  1.9 - 2.7 mg/dL Final   Phosphorus 98/83/7973 3.2  2.5 - 5.0 mg/dL Final   Glucose, POC 98/83/7973 101 (H)  70 - 99 mg/dL Final   Pre-Meal   Glucose, POC 11/17/2024 104 (H)  70 - 99 mg/dL Final   Glucose, POC 98/83/7973 172 (H)  70 - 99 mg/dL Final   Pre-Meal   Glucose, POC 11/17/2024 131 (H)  70 - 99 mg/dL Final   Recheck/confirm   Sodium 11/18/2024 139  136 - 145 mmol/L Final   Potassium 11/18/2024 3.7  3.4 - 4.5 mmol/L Final   Chloride 11/18/2024 105  98 - 107 mmol/L Final   CO2 11/18/2024 29  21 - 31 mmol/L Final   High lactate dehydrogenase (LDH) concentrations in patient samples may cause falsely increased bicarbonate results. If markedly elevated LDH levels are suspected, please assess results in conjunction with patient's LDH values.   Anion Gap 11/18/2024 5 (L)  6 - 14 mmol/L Final   Glucose, Random 11/18/2024 111 (H)  70 - 99 mg/dL Final   Blood Urea Nitrogen (BUN) 11/18/2024 17  7 - 25 mg/dL Final   Creatinine 98/82/7973 0.99  0.70 - 1.30 mg/dL Final   eGFR 98/82/7973 74  >59 mL/min/1.66m2 Final   GFR estimated by CKD-EPI equations(NKF 2021).   Recommend confirmation of Cr-based eGFR by using Cys-based eGFR and other filtration markers (if applicable) in complex cases and clinical decision-making, as needed.   Calcium  11/18/2024 8.8  8.6 - 10.3 mg/dL Final   BUN/Creatinine Ratio 11/18/2024    Final   Creatinine is normal, ratio is not clinically indicated.  Magnesium 11/18/2024 1.8 (L)  1.9 - 2.7 mg/dL Final   Phosphorus 98/82/7973 2.8  2.5 - 5.0 mg/dL Final   Glucose, POC 98/82/7973 112  (H)  70 - 99 mg/dL Final   Pre-Meal   Glucose, POC 11/18/2024 112 (H)  70 - 99 mg/dL Final   Pre-Meal    IMAGING DATA  CLINICAL DATA:  Dizziness and giddiness for 2 years.   EXAM: MRI HEAD WITHOUT CONTRAST   TECHNIQUE: Multiplanar, multiecho pulse sequences of the brain and surrounding structures were obtained without intravenous contrast.   COMPARISON:  08/27/2010   FINDINGS: Brain: No acute or interval infarction, hemorrhage, hydrocephalus, extra-axial collection or mass lesion.   Normal cerebral volume for age. Moderate chronic microvascular disease with ischemic gliosis throughout much of the cerebral white matter, periventricular predominant.   Vascular: Normal flow voids.   Skull and upper cervical spine: Normal marrow signal.   Sinuses/Orbits: Small mucous retention cysts in the right maxillary sinus.   CONCLUSION: 1. No acute or reversible finding. 2. Moderate chronic microvascular disease that is similar to 2011. Stable age normal brain volume.     Electronically Signed   By: Cassondra Roulette M.D.   On: 08/19/2016 15:24  ____________________  MDM Professional time spent on direct face-to-face patient evaluation, care and coordination of care including indirect time spent on review of applicable labs, imaging, outside records, note composition on same day of visit: 40 minutes.   Orders No orders of the defined types were placed in this encounter.  No orders of the defined types were placed in this encounter.        [1] Allergies Allergen Reactions   Ciprofloxacin Other (See Comments)   Latanoprost Other (See Comments)    INeffective   Ace Inhibitors Other (See Comments)    unknown  [2] Current Outpatient Medications  Medication Sig Dispense Refill   albuterol  HFA (PROVENTIL  HFA;VENTOLIN  HFA;PROAIR  HFA) 90 mcg/actuation inhaler INHALE 2 PUFFS INTO THE LUNGS EVERY SIX HOURS AS NEEDED FOR WHEEZING (Patient taking differently: as needed.)  6.7 g 3   aspirin  81 mg EC tablet Take 81 mg by mouth daily.     blood pressure test kit-medium kit Check blood pressure as needed. Icd - I10 1 each 0   Breo Ellipta  100-25 mcg/dose inhaler Inhale 1 puff daily. (Patient taking differently: Inhale 1 puff daily.) 180 each 3   clonazePAM  (KlonoPIN ) 1 mg tablet Take 1 tablet (1 mg total) by mouth once as needed for anxiety. 30 tablet 0   compr.stocking,knee,long,x-lrg misc 2 each by miscellaneous route daily. 2 each 1   cyanocobalamin  (VITAMIN B12) 1,000 mcg tablet Take 2 tablets (2,000 mcg total) by mouth daily.     loratadine  (CLARITIN ) 10 mg tablet Take 10 mg by mouth daily.     magnesium chloride (Slow-Mag) 71.5 mg TbEC tablet Take 2 tablets in the morning and 1 tablet in the evening (Patient taking differently: Take 1 tablet by mouth every morning. Take 2 tablets in the morning and 1 tablet in the evening) 90 tablet 5   magnesium chloride (SLOW-MAG) 71.5 mg TbEC tablet Take 1 tablet by mouth every evening.     metoprolol succinate (TOPROL XL) 25 mg 24 hr tablet Take 1 tablet (25 mg total) by mouth daily. 90 tablet 0   nitroglycerin (NITROSTAT) 0.4 mg SL tablet Place 0.4 mg under the tongue every 5 (five) minutes as needed for chest pain. 25 tablet 3   rosuvastatin  (CRESTOR ) 10 mg tablet TAKE 1 TABLET  BY MOUTH DAILY FOR CHOLESTEROL (Patient taking differently: Take 10 mg by mouth every evening. for cholesterol) 90 tablet 1   sertraline  (ZOLOFT ) 25 mg tablet TAKE 1 TABLET BY MOUTH DAILY 90 tablet 1   Spiriva with HandiHaler 18 mcg per inhalation Place 1 capsule into inhaler and inhale in the morning. (After placing 1 capsule in device, press green button, and immediately take 2 inhalations. Do NOT swallow capsule.). 30 capsule 5   Xarelto  15 mg tablet TAKE 1 TABLET (15 MG TOTAL) BY MOUTH DAILY WITH DINNER. (Patient taking differently: Take 15 mg by mouth daily with dinner.) 90 tablet 3   No current facility-administered medications for  this visit.  [3] Past Medical History: Diagnosis Date   Bronchiectasis (CMD)    Cerebellar ataxia    (CMD) 08/02/2013   05/2019: Under care of neurology   Cerebrovascular accident (CVA)    (CMD) 10/21/2016   05/2019: Under care of neurology   COPD (chronic obstructive pulmonary disease) (CMD)    Diabetes mellitus (CMD)    Difficulty urinating 05/13/2020   Diplopia 08/02/2013   Overview:  IMPRESSION: 08/28/2010 had spell of diplopia -skew deviation- some time ago.  To see [facility] for 2nd opinion.   Dry eye syndrome of both eyes 12/11/2019   DVT of deep femoral vein, right (CMD) 04/26/2019   05/2019: Under care of cardiology/coumadin clinic   Essential hypertension 07/08/2012   Gastroesophageal reflux disease 07/08/2012   Glaucoma    Hearing aid worn 06/15/2016   History of laser assisted in situ keratomileusis 02/20/2015   History of pulmonary embolism 02/10/2016   Hyperlipidemia associated with type 2 diabetes mellitus    (CMD) 07/12/2018   Hypertension    Imbalance 06/15/2016   05/2019: Under care of neurology   Insomnia with sleep apnea 08/02/2013   Overview:  IMPRESSION: Continue lunesta 2 mg qhs 08/05/2011 possibley caused rash- substitute gabapentin  08/03/2012 patient now only on clonazepam , sleeps in chair from 2030 to 2200 then goes to bed after and has fragmented sleep after 0400 and feels off kilter during teh day ? change the habit.  08/02/2013 stable on clonazepam - still has early awakening, trial of low dose ambien Overview:  IMPRESSI   Keratoconjunctivitis sicca not specified as Sjogren's, bilateral    Macular edema 10/31/2013   Multiple subsegmental pulmonary emboli without acute cor pulmonale    (CMD) 04/26/2019   05/2019: Under care of cardiology/coumadin clinic   Obstructive sleep apnea 08/02/2013   [facility] IMPRESSION: Supine OSA (AHI=13 wotj desats tp 87% sup=all occurred-non nonsupine (04-18-06)  05/2019: previously under care of pulmonology    Orthostatic hypotension 09/16/2020   OSA (obstructive sleep apnea)    Pannus (corneal), left    Pneumonia    Primary open angle glaucoma (POAG) of both eyes, moderate stage 12/11/2019   Primary open angle glaucoma of right eye, mild stage 04/18/2014   Pulmonary embolism (CMD)    History of    Renal insufficiency, mild 05/25/2019   05/2019: He has a mild-moderate renal decline with GFR 49-62 dating back one year. No microalbuminuria.   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   Type 2 diabetes mellitus with stage 3 chronic kidney disease, without long-term current use of insulin    (CMD) 04/30/2016   VBI (vertebrobasilar insufficiency) 04/30/2016   05/2019: Under care of neurology  Vertigo 10/05/2017   05/2019: Under care of neurology  [4] Past Surgical History: Procedure Laterality Date   CARDIAC CATHETERIZATION     Procedure: CARDIAC CATHETERIZATION   CATARACT EXTRACTION Right    Procedure: YAG CAPSULOTOMY   CATARACT EXTRACTION Bilateral    Procedure: CATARACT EXTRACTION   COLONOSCOPY     Procedure: COLONOSCOPY   CYSTOSCOPY N/A 04/30/2021   Procedure: UROLIFT;  Surgeon: Charlie Fallow Puschinsky, MD;  Location: [facility];  Service: Urology;  Laterality: N/A;   ESOPHAGOGASTRODUODENOSCOPY     Procedure: ESOPHAGOGASTRODUODENOSCOPY   IRIDOTOMY / IRIDECTOMY     Procedure: IRIDOTOMY / IRIDECTOMY   NEUROPLASTY / TRANSPOSITION ULNAR NERVE AT ELBOW Right    Procedure: NEUROPLASTY / TRANSPOSITION ULNAR NERVE AT ELBOW   REFRACTIVE SURGERY Bilateral 1999   Procedure: REFRACTIVE SURGERY; Dr. Ryan  [5] Family History Adopted: Yes  Problem Relation Name Age of Onset   No Known Problems Mother     No Known Problems Father     No Known Problems Sister     No Known Problems Brother     No Known Problems  Daughter     No Known Problems Son     No Known Problems Maternal Aunt     No Known Problems Maternal Uncle     No Known Problems Paternal Aunt     No Known Problems Paternal Uncle     No Known Problems Maternal Grandmother     No Known Problems Maternal Grandfather     No Known Problems Paternal Grandmother     No Known Problems Paternal Grandfather     Blindness Neg Hx     Glaucoma Neg Hx     Macular degeneration Neg Hx     Cataracts Neg Hx
